# Patient Record
Sex: Male | Born: 1975 | Race: White | Hispanic: No | Marital: Married | Smoking: Current every day smoker
Health system: Southern US, Community
[De-identification: ages and names within clinical notes are randomized; demographics above are authoritative.]

## PROBLEM LIST (undated history)

## (undated) DIAGNOSIS — N200 Calculus of kidney: Secondary | ICD-10-CM

## (undated) DIAGNOSIS — M549 Dorsalgia, unspecified: Secondary | ICD-10-CM

## (undated) DIAGNOSIS — G8929 Other chronic pain: Secondary | ICD-10-CM

## (undated) DIAGNOSIS — I10 Essential (primary) hypertension: Secondary | ICD-10-CM

---

## 2002-07-27 ENCOUNTER — Emergency Department (HOSPITAL_COMMUNITY): Admission: EM | Admit: 2002-07-27 | Discharge: 2002-07-27 | Payer: Self-pay | Admitting: Emergency Medicine

## 2003-01-26 ENCOUNTER — Emergency Department (HOSPITAL_COMMUNITY): Admission: EM | Admit: 2003-01-26 | Discharge: 2003-01-26 | Payer: Self-pay | Admitting: *Deleted

## 2003-03-07 ENCOUNTER — Emergency Department (HOSPITAL_COMMUNITY): Admission: EM | Admit: 2003-03-07 | Discharge: 2003-03-07 | Payer: Self-pay | Admitting: *Deleted

## 2003-03-14 ENCOUNTER — Emergency Department (HOSPITAL_COMMUNITY): Admission: EM | Admit: 2003-03-14 | Discharge: 2003-03-14 | Payer: Self-pay | Admitting: Emergency Medicine

## 2003-05-11 ENCOUNTER — Emergency Department (HOSPITAL_COMMUNITY): Admission: EM | Admit: 2003-05-11 | Discharge: 2003-05-11 | Payer: Self-pay | Admitting: Emergency Medicine

## 2003-06-17 ENCOUNTER — Emergency Department (HOSPITAL_COMMUNITY): Admission: EM | Admit: 2003-06-17 | Discharge: 2003-06-17 | Payer: Self-pay | Admitting: Emergency Medicine

## 2003-08-16 ENCOUNTER — Emergency Department (HOSPITAL_COMMUNITY): Admission: EM | Admit: 2003-08-16 | Discharge: 2003-08-16 | Payer: Self-pay | Admitting: Emergency Medicine

## 2003-09-17 ENCOUNTER — Emergency Department (HOSPITAL_COMMUNITY): Admission: EM | Admit: 2003-09-17 | Discharge: 2003-09-17 | Payer: Self-pay | Admitting: Emergency Medicine

## 2003-10-22 ENCOUNTER — Emergency Department (HOSPITAL_COMMUNITY): Admission: EM | Admit: 2003-10-22 | Discharge: 2003-10-22 | Payer: Self-pay | Admitting: Emergency Medicine

## 2003-12-22 ENCOUNTER — Emergency Department (HOSPITAL_COMMUNITY): Admission: EM | Admit: 2003-12-22 | Discharge: 2003-12-22 | Payer: Self-pay | Admitting: *Deleted

## 2004-01-18 ENCOUNTER — Emergency Department (HOSPITAL_COMMUNITY): Admission: EM | Admit: 2004-01-18 | Discharge: 2004-01-18 | Payer: Self-pay | Admitting: Emergency Medicine

## 2004-01-22 ENCOUNTER — Emergency Department (HOSPITAL_COMMUNITY): Admission: EM | Admit: 2004-01-22 | Discharge: 2004-01-22 | Payer: Self-pay | Admitting: Emergency Medicine

## 2004-05-05 ENCOUNTER — Emergency Department (HOSPITAL_COMMUNITY): Admission: EM | Admit: 2004-05-05 | Discharge: 2004-05-05 | Payer: Self-pay | Admitting: *Deleted

## 2004-07-18 ENCOUNTER — Emergency Department (HOSPITAL_COMMUNITY): Admission: EM | Admit: 2004-07-18 | Discharge: 2004-07-18 | Payer: Self-pay | Admitting: *Deleted

## 2004-08-09 ENCOUNTER — Emergency Department (HOSPITAL_COMMUNITY): Admission: EM | Admit: 2004-08-09 | Discharge: 2004-08-09 | Payer: Self-pay | Admitting: Emergency Medicine

## 2004-09-07 ENCOUNTER — Emergency Department (HOSPITAL_COMMUNITY): Admission: EM | Admit: 2004-09-07 | Discharge: 2004-09-07 | Payer: Self-pay | Admitting: Emergency Medicine

## 2004-09-27 ENCOUNTER — Emergency Department (HOSPITAL_COMMUNITY): Admission: EM | Admit: 2004-09-27 | Discharge: 2004-09-27 | Payer: Self-pay | Admitting: Emergency Medicine

## 2004-10-19 ENCOUNTER — Emergency Department (HOSPITAL_COMMUNITY): Admission: EM | Admit: 2004-10-19 | Discharge: 2004-10-19 | Payer: Self-pay | Admitting: Emergency Medicine

## 2004-12-16 ENCOUNTER — Emergency Department (HOSPITAL_COMMUNITY): Admission: EM | Admit: 2004-12-16 | Discharge: 2004-12-16 | Payer: Self-pay | Admitting: Emergency Medicine

## 2005-01-01 ENCOUNTER — Emergency Department (HOSPITAL_COMMUNITY): Admission: EM | Admit: 2005-01-01 | Discharge: 2005-01-01 | Payer: Self-pay | Admitting: Emergency Medicine

## 2005-01-30 ENCOUNTER — Emergency Department (HOSPITAL_COMMUNITY): Admission: EM | Admit: 2005-01-30 | Discharge: 2005-01-30 | Payer: Self-pay | Admitting: Emergency Medicine

## 2005-04-16 ENCOUNTER — Emergency Department (HOSPITAL_COMMUNITY): Admission: EM | Admit: 2005-04-16 | Discharge: 2005-04-16 | Payer: Self-pay | Admitting: Emergency Medicine

## 2005-05-04 ENCOUNTER — Emergency Department (HOSPITAL_COMMUNITY): Admission: EM | Admit: 2005-05-04 | Discharge: 2005-05-04 | Payer: Self-pay | Admitting: Emergency Medicine

## 2005-06-15 ENCOUNTER — Emergency Department (HOSPITAL_COMMUNITY): Admission: EM | Admit: 2005-06-15 | Discharge: 2005-06-15 | Payer: Self-pay | Admitting: Emergency Medicine

## 2005-08-01 ENCOUNTER — Emergency Department (HOSPITAL_COMMUNITY): Admission: EM | Admit: 2005-08-01 | Discharge: 2005-08-01 | Payer: Self-pay | Admitting: Emergency Medicine

## 2005-09-15 ENCOUNTER — Emergency Department (HOSPITAL_COMMUNITY): Admission: EM | Admit: 2005-09-15 | Discharge: 2005-09-15 | Payer: Self-pay | Admitting: Emergency Medicine

## 2005-11-01 ENCOUNTER — Emergency Department (HOSPITAL_COMMUNITY): Admission: EM | Admit: 2005-11-01 | Discharge: 2005-11-01 | Payer: Self-pay | Admitting: Emergency Medicine

## 2005-11-16 ENCOUNTER — Emergency Department (HOSPITAL_COMMUNITY): Admission: EM | Admit: 2005-11-16 | Discharge: 2005-11-16 | Payer: Self-pay | Admitting: Emergency Medicine

## 2006-01-26 ENCOUNTER — Emergency Department (HOSPITAL_COMMUNITY): Admission: EM | Admit: 2006-01-26 | Discharge: 2006-01-26 | Payer: Self-pay | Admitting: Emergency Medicine

## 2006-03-15 ENCOUNTER — Emergency Department (HOSPITAL_COMMUNITY): Admission: EM | Admit: 2006-03-15 | Discharge: 2006-03-15 | Payer: Self-pay | Admitting: Emergency Medicine

## 2006-06-10 ENCOUNTER — Emergency Department (HOSPITAL_COMMUNITY): Admission: EM | Admit: 2006-06-10 | Discharge: 2006-06-11 | Payer: Self-pay | Admitting: Emergency Medicine

## 2006-06-19 ENCOUNTER — Emergency Department (HOSPITAL_COMMUNITY): Admission: EM | Admit: 2006-06-19 | Discharge: 2006-06-19 | Payer: Self-pay | Admitting: Emergency Medicine

## 2006-07-21 ENCOUNTER — Emergency Department (HOSPITAL_COMMUNITY): Admission: EM | Admit: 2006-07-21 | Discharge: 2006-07-21 | Payer: Self-pay | Admitting: Emergency Medicine

## 2006-08-18 ENCOUNTER — Emergency Department (HOSPITAL_COMMUNITY): Admission: EM | Admit: 2006-08-18 | Discharge: 2006-08-18 | Payer: Self-pay | Admitting: Emergency Medicine

## 2006-12-03 ENCOUNTER — Emergency Department (HOSPITAL_COMMUNITY): Admission: EM | Admit: 2006-12-03 | Discharge: 2006-12-03 | Payer: Self-pay | Admitting: Emergency Medicine

## 2007-05-05 ENCOUNTER — Emergency Department (HOSPITAL_COMMUNITY): Admission: EM | Admit: 2007-05-05 | Discharge: 2007-05-06 | Payer: Self-pay | Admitting: Emergency Medicine

## 2007-08-17 ENCOUNTER — Emergency Department (HOSPITAL_COMMUNITY): Admission: EM | Admit: 2007-08-17 | Discharge: 2007-08-17 | Payer: Self-pay | Admitting: Emergency Medicine

## 2007-10-22 ENCOUNTER — Emergency Department (HOSPITAL_COMMUNITY): Admission: EM | Admit: 2007-10-22 | Discharge: 2007-10-22 | Payer: Self-pay | Admitting: Emergency Medicine

## 2007-12-03 ENCOUNTER — Emergency Department (HOSPITAL_COMMUNITY): Admission: EM | Admit: 2007-12-03 | Discharge: 2007-12-03 | Payer: Self-pay | Admitting: Emergency Medicine

## 2008-02-17 ENCOUNTER — Emergency Department (HOSPITAL_COMMUNITY): Admission: EM | Admit: 2008-02-17 | Discharge: 2008-02-17 | Payer: Self-pay | Admitting: Emergency Medicine

## 2008-03-04 ENCOUNTER — Emergency Department (HOSPITAL_COMMUNITY): Admission: EM | Admit: 2008-03-04 | Discharge: 2008-03-05 | Payer: Self-pay | Admitting: Emergency Medicine

## 2008-03-25 ENCOUNTER — Emergency Department (HOSPITAL_COMMUNITY): Admission: EM | Admit: 2008-03-25 | Discharge: 2008-03-25 | Payer: Self-pay | Admitting: Emergency Medicine

## 2008-05-01 ENCOUNTER — Emergency Department (HOSPITAL_COMMUNITY): Admission: EM | Admit: 2008-05-01 | Discharge: 2008-05-01 | Payer: Self-pay | Admitting: Emergency Medicine

## 2008-05-06 ENCOUNTER — Emergency Department (HOSPITAL_COMMUNITY): Admission: EM | Admit: 2008-05-06 | Discharge: 2008-05-06 | Payer: Self-pay | Admitting: Emergency Medicine

## 2008-06-22 ENCOUNTER — Emergency Department (HOSPITAL_COMMUNITY): Admission: EM | Admit: 2008-06-22 | Discharge: 2008-06-22 | Payer: Self-pay | Admitting: Emergency Medicine

## 2008-09-25 ENCOUNTER — Emergency Department (HOSPITAL_COMMUNITY): Admission: EM | Admit: 2008-09-25 | Discharge: 2008-09-25 | Payer: Self-pay | Admitting: Emergency Medicine

## 2008-10-07 ENCOUNTER — Ambulatory Visit (HOSPITAL_COMMUNITY): Admission: RE | Admit: 2008-10-07 | Discharge: 2008-10-07 | Payer: Self-pay | Admitting: Urology

## 2008-11-26 ENCOUNTER — Emergency Department (HOSPITAL_COMMUNITY): Admission: EM | Admit: 2008-11-26 | Discharge: 2008-11-26 | Payer: Self-pay | Admitting: Emergency Medicine

## 2008-12-15 ENCOUNTER — Emergency Department (HOSPITAL_COMMUNITY): Admission: EM | Admit: 2008-12-15 | Discharge: 2008-12-15 | Payer: Self-pay | Admitting: Emergency Medicine

## 2008-12-17 ENCOUNTER — Emergency Department (HOSPITAL_COMMUNITY): Admission: EM | Admit: 2008-12-17 | Discharge: 2008-12-18 | Payer: Self-pay | Admitting: Emergency Medicine

## 2009-01-18 ENCOUNTER — Emergency Department (HOSPITAL_COMMUNITY): Admission: EM | Admit: 2009-01-18 | Discharge: 2009-01-18 | Payer: Self-pay | Admitting: Emergency Medicine

## 2009-01-31 ENCOUNTER — Emergency Department (HOSPITAL_COMMUNITY): Admission: EM | Admit: 2009-01-31 | Discharge: 2009-01-31 | Payer: Self-pay | Admitting: Emergency Medicine

## 2009-03-01 ENCOUNTER — Emergency Department: Payer: Self-pay | Admitting: Emergency Medicine

## 2009-04-17 ENCOUNTER — Emergency Department (HOSPITAL_COMMUNITY): Admission: EM | Admit: 2009-04-17 | Discharge: 2009-04-17 | Payer: Self-pay | Admitting: Emergency Medicine

## 2009-05-24 ENCOUNTER — Emergency Department (HOSPITAL_COMMUNITY): Admission: EM | Admit: 2009-05-24 | Discharge: 2009-05-24 | Payer: Self-pay | Admitting: Emergency Medicine

## 2009-10-04 IMAGING — CT CT ABDOMEN W/O CM
2 of 4 series · 17 of 46 positions shown, 19 images · non-contrast
Comparison: 03/04/2008

CT ABDOMEN

CLINICAL DATA: Right flank pain and hematuria.  Nephrolithiasis.

CT ABDOMEN AND PELVIS WITHOUT CONTRAST
TECHNIQUE: Multidetector CT imaging of the abdomen and pelvis was
performed following the standard protocol without intravenous
contrast.

[Series 2: stone under 45 under 200 w/p 5.0m · axial · 0.66mm/px · z∈[-454,-30]mm · 14 of 95 slices shown, 16 images]
[im 5/95  soft-tissue]
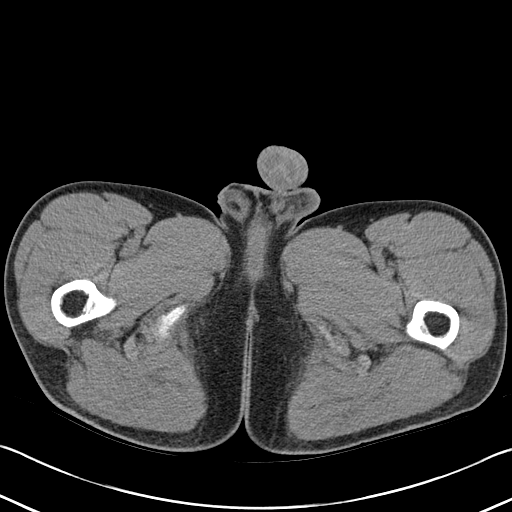
[im 5/95  bone]
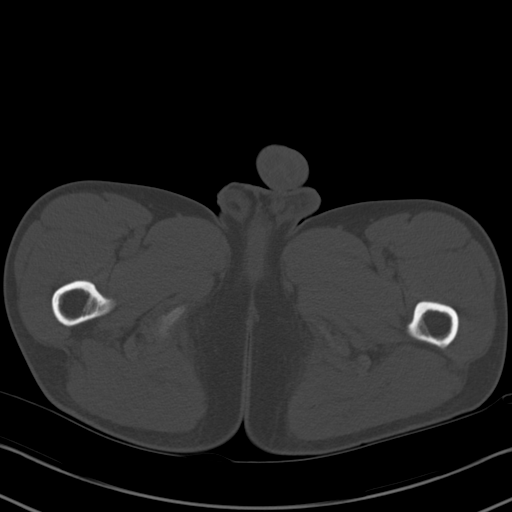
[im 13/95  soft-tissue]
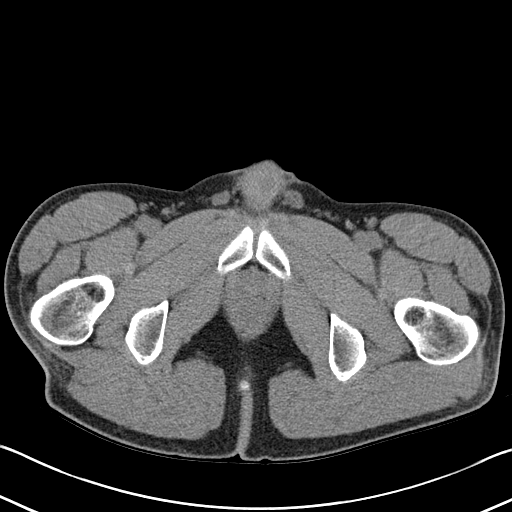
[im 17/95  soft-tissue]
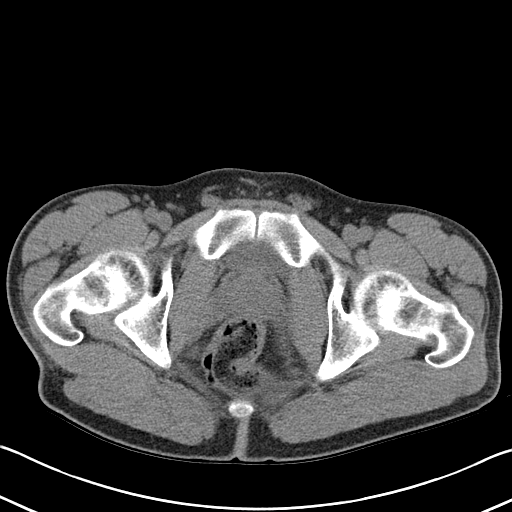
[im 25/95  soft-tissue]
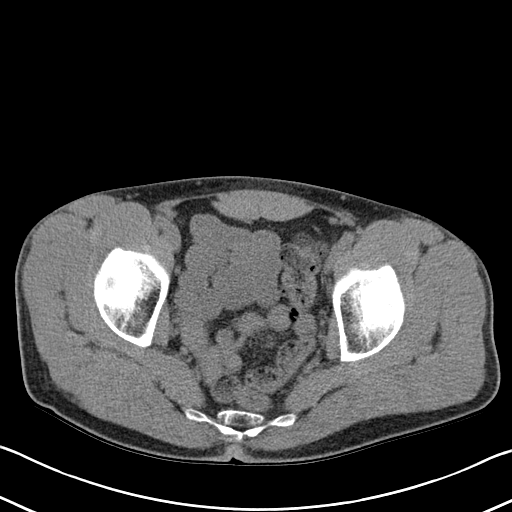
[im 33/95  soft-tissue]
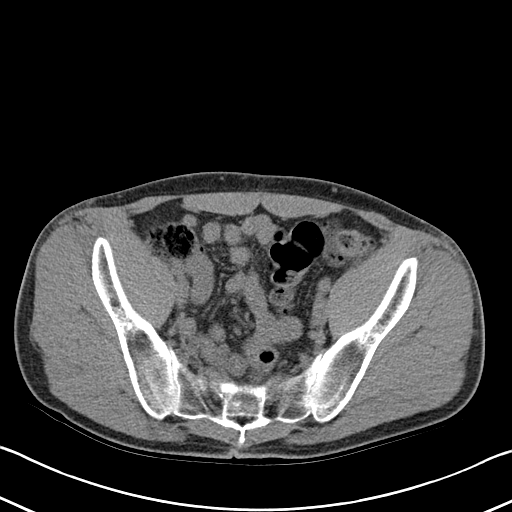
[im 37/95  soft-tissue]
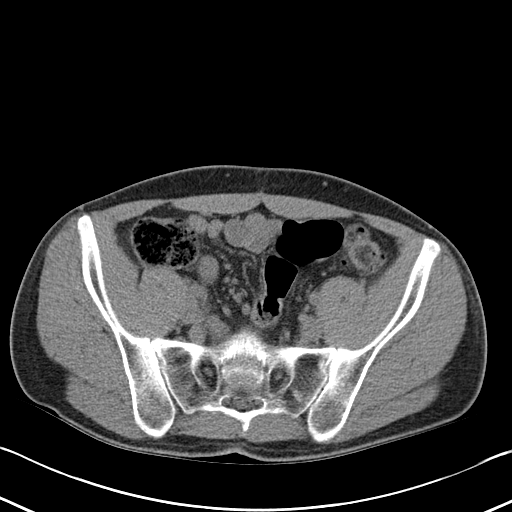
[im 45/95  soft-tissue]
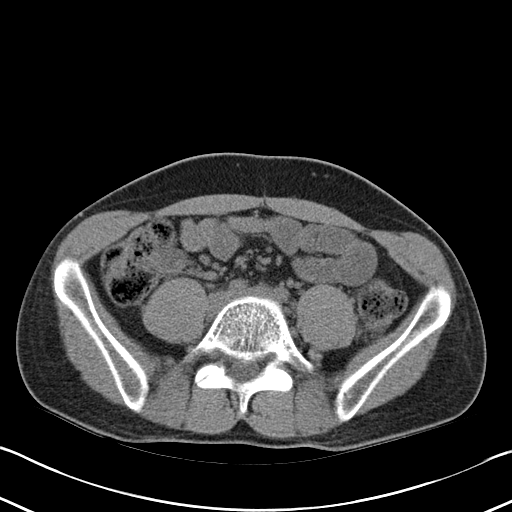
[im 50/95  soft-tissue]
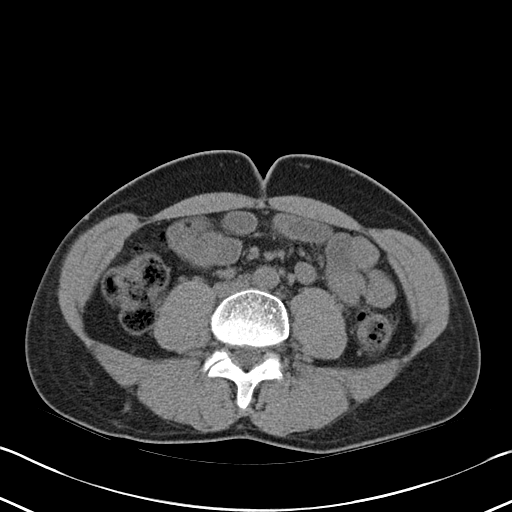
[im 58/95  soft-tissue]
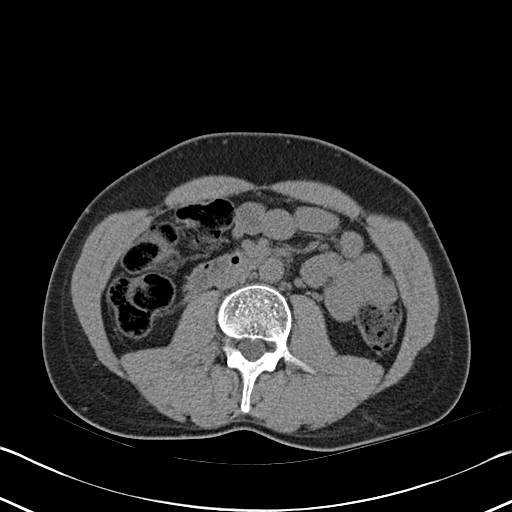
[im 58/95  bone]
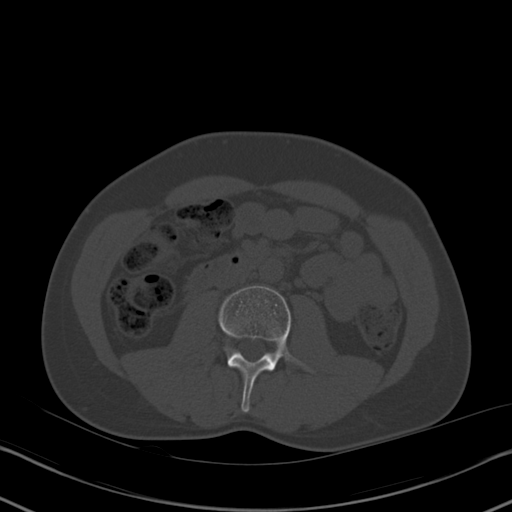
[im 62/95  soft-tissue]
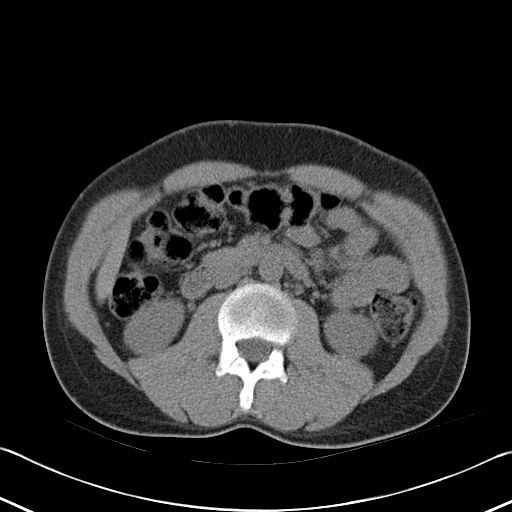
[im 70/95  soft-tissue]
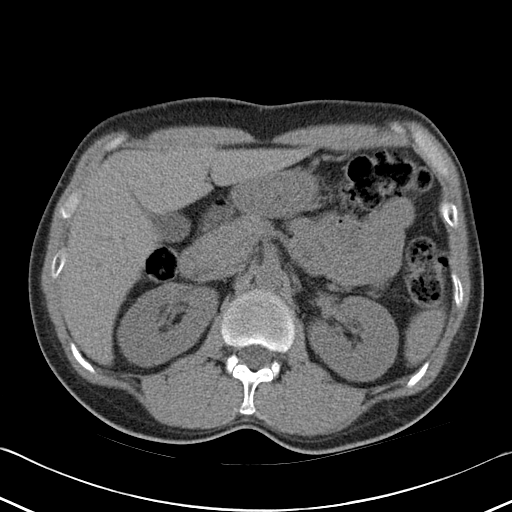
[im 78/95  soft-tissue]
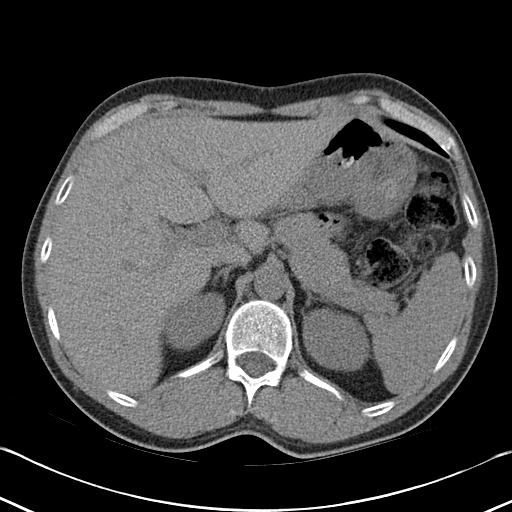
[im 82/95  soft-tissue]
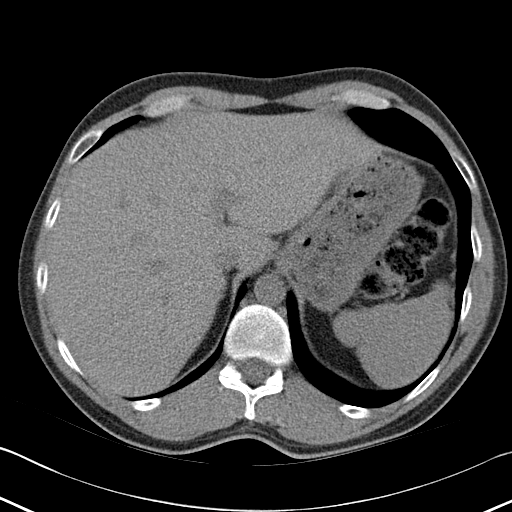
[im 90/95  soft-tissue]
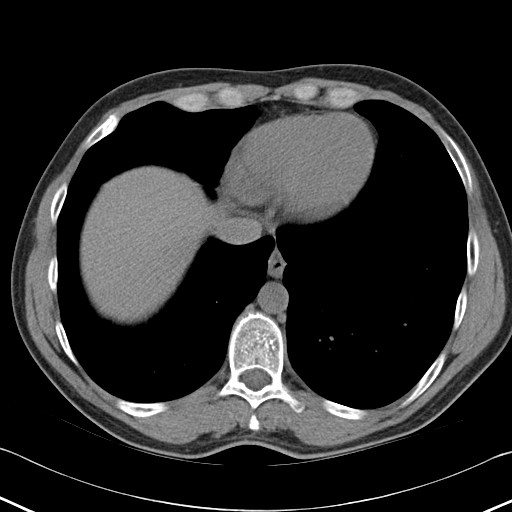

[Series 4: mpr coronal (id) · coronal · 0.93mm/px · 3 of 78 slices shown]
[im 26/78  soft-tissue]
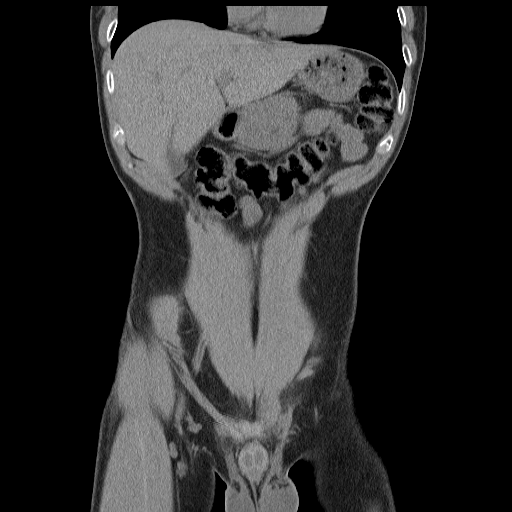
[im 35/78  soft-tissue]
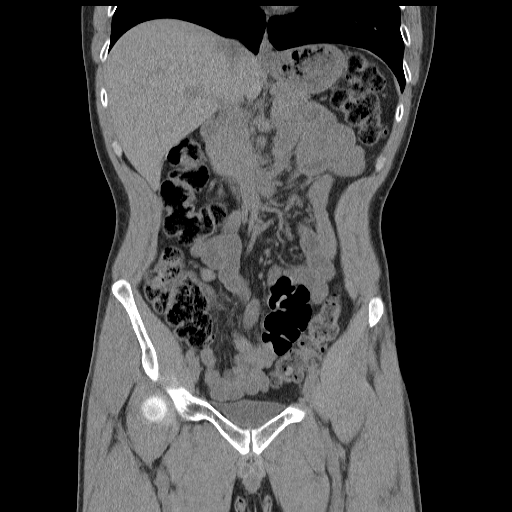
[im 43/78  soft-tissue]
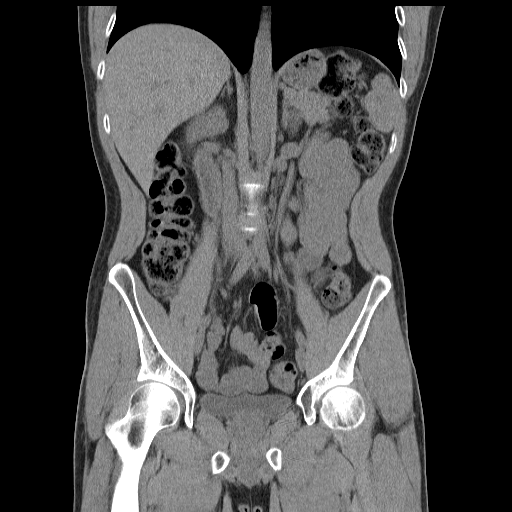

[17 of 46 positions shown; findings below may reference images not displayed]

FINDINGS: Multiple tiny less than 5 mm bilateral renal calculi are
again seen.  There is no evidence of hydronephrosis involving
either kidney.  There is no evidence of perinephric fluid or
inflammatory changes.

The abdominal parenchymal organs are otherwise unremarkable
appearance on this noncontrast study.  There is no evidence of mass
or inflammatory process.  No abnormal fluid collections are seen.
There is no evidence of dilated bowel loops.
IMPRESSION: Bilateral nephrolithiasis.  No evidence of hydronephrosis or other
acute findings.

CT PELVIS
FINDINGS: There is no evidence of ureteral calculi or dilatation.
Several left pelvic phleboliths are stable.

There is no evidence of pelvic mass or inflammatory process.  No
abnormal fluid collections are seen.  There is no evidence of
dilated bowel loops.
IMPRESSION: No evidence of ureteral calculi or other acute findings.

## 2010-06-14 ENCOUNTER — Emergency Department (HOSPITAL_COMMUNITY)
Admission: EM | Admit: 2010-06-14 | Discharge: 2010-06-14 | Payer: Self-pay | Source: Home / Self Care | Admitting: Emergency Medicine

## 2010-06-28 ENCOUNTER — Encounter: Payer: Self-pay | Admitting: Orthopaedic Surgery

## 2010-06-29 ENCOUNTER — Encounter: Payer: Self-pay | Admitting: Family Medicine

## 2010-09-14 LAB — URINALYSIS, ROUTINE W REFLEX MICROSCOPIC
Ketones, ur: NEGATIVE mg/dL
Nitrite: NEGATIVE
Protein, ur: NEGATIVE mg/dL

## 2010-09-16 LAB — URINALYSIS, ROUTINE W REFLEX MICROSCOPIC
Ketones, ur: NEGATIVE mg/dL
Nitrite: POSITIVE — AB
Protein, ur: NEGATIVE mg/dL
Urobilinogen, UA: 0.2 mg/dL (ref 0.0–1.0)
pH: 5.5 (ref 5.0–8.0)

## 2010-09-16 LAB — URINE MICROSCOPIC-ADD ON

## 2011-03-08 LAB — URINALYSIS, ROUTINE W REFLEX MICROSCOPIC
Leukocytes, UA: NEGATIVE
Nitrite: POSITIVE — AB
Protein, ur: >300 — AB
Urobilinogen, UA: 1

## 2011-03-10 LAB — CULTURE, ROUTINE-ABSCESS

## 2011-03-19 ENCOUNTER — Emergency Department (HOSPITAL_COMMUNITY)
Admission: EM | Admit: 2011-03-19 | Discharge: 2011-03-19 | Disposition: A | Discharge status: Home / Self Care | Payer: Self-pay | Attending: Emergency Medicine | Admitting: Emergency Medicine

## 2011-03-19 ENCOUNTER — Emergency Department (HOSPITAL_COMMUNITY): Payer: Self-pay

## 2011-03-19 ENCOUNTER — Encounter: Payer: Self-pay | Admitting: Oncology

## 2011-03-19 DIAGNOSIS — S20219A Contusion of unspecified front wall of thorax, initial encounter: Secondary | ICD-10-CM | POA: Insufficient documentation

## 2011-03-19 DIAGNOSIS — R079 Chest pain, unspecified: Secondary | ICD-10-CM | POA: Insufficient documentation

## 2011-03-19 DIAGNOSIS — Z87442 Personal history of urinary calculi: Secondary | ICD-10-CM | POA: Insufficient documentation

## 2011-03-19 DIAGNOSIS — J45909 Unspecified asthma, uncomplicated: Secondary | ICD-10-CM | POA: Insufficient documentation

## 2011-03-19 HISTORY — DX: Calculus of kidney: N20.0

## 2011-03-19 MED ORDER — HYDROCODONE-ACETAMINOPHEN 5-500 MG PO TABS: 1.0000 | ORAL_TABLET | Freq: Four times a day (QID) | ORAL | Status: AC | PRN

## 2011-03-19 NOTE — ED Notes (Signed)
Pt discussed with Dr. Rubin Payor; CXR order obtained.
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
## 2011-04-21 ENCOUNTER — Emergency Department (HOSPITAL_COMMUNITY)
Admission: EM | Admit: 2011-04-21 | Discharge: 2011-04-21 | Disposition: A | Discharge status: Home / Self Care | Payer: Medicaid Other | Attending: Emergency Medicine | Admitting: Emergency Medicine

## 2011-04-21 ENCOUNTER — Emergency Department (HOSPITAL_COMMUNITY): Payer: Medicaid Other

## 2011-04-21 ENCOUNTER — Encounter (HOSPITAL_COMMUNITY): Payer: Self-pay | Admitting: *Deleted

## 2011-04-21 DIAGNOSIS — S4990XA Unspecified injury of shoulder and upper arm, unspecified arm, initial encounter: Secondary | ICD-10-CM

## 2011-04-21 DIAGNOSIS — S4350XA Sprain of unspecified acromioclavicular joint, initial encounter: Secondary | ICD-10-CM | POA: Insufficient documentation

## 2011-04-21 DIAGNOSIS — M25519 Pain in unspecified shoulder: Secondary | ICD-10-CM | POA: Insufficient documentation

## 2011-04-21 DIAGNOSIS — J45909 Unspecified asthma, uncomplicated: Secondary | ICD-10-CM | POA: Insufficient documentation

## 2011-04-21 DIAGNOSIS — Z87442 Personal history of urinary calculi: Secondary | ICD-10-CM | POA: Insufficient documentation

## 2011-04-21 DIAGNOSIS — F17200 Nicotine dependence, unspecified, uncomplicated: Secondary | ICD-10-CM | POA: Insufficient documentation
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
## 2011-04-21 NOTE — ED Notes (Signed)
Pt given discharge instructions, paperwork, pt verbalized understanding.   

## 2011-05-20 ENCOUNTER — Encounter (HOSPITAL_COMMUNITY): Payer: Self-pay | Admitting: Emergency Medicine

## 2011-05-20 ENCOUNTER — Emergency Department (HOSPITAL_COMMUNITY)
Admission: EM | Admit: 2011-05-20 | Discharge: 2011-05-20 | Disposition: A | Discharge status: Home / Self Care | Payer: Medicaid Other | Attending: Emergency Medicine | Admitting: Emergency Medicine

## 2011-05-20 DIAGNOSIS — J45909 Unspecified asthma, uncomplicated: Secondary | ICD-10-CM | POA: Insufficient documentation

## 2011-05-20 DIAGNOSIS — B3749 Other urogenital candidiasis: Secondary | ICD-10-CM | POA: Insufficient documentation

## 2011-05-20 DIAGNOSIS — N200 Calculus of kidney: Secondary | ICD-10-CM | POA: Insufficient documentation

## 2011-05-20 DIAGNOSIS — F17200 Nicotine dependence, unspecified, uncomplicated: Secondary | ICD-10-CM | POA: Insufficient documentation

## 2011-05-20 HISTORY — DX: Calculus of kidney: N20.0

## 2011-05-20 LAB — URINALYSIS, ROUTINE W REFLEX MICROSCOPIC
Bilirubin Urine: NEGATIVE
Glucose, UA: NEGATIVE mg/dL
Leukocytes, UA: NEGATIVE
Protein, ur: 30 mg/dL — AB

## 2011-05-20 LAB — URINE MICROSCOPIC-ADD ON

## 2011-05-20 MED ORDER — TAMSULOSIN HCL 0.4 MG PO CAPS: 0.4000 mg | ORAL_CAPSULE | Freq: Two times a day (BID) | ORAL | Status: DC

## 2011-05-20 MED ORDER — HYDROCODONE-ACETAMINOPHEN 5-500 MG PO TABS: 1.0000 | ORAL_TABLET | Freq: Four times a day (QID) | ORAL | Status: AC | PRN

## 2011-05-20 MED ORDER — NAPROXEN 500 MG PO TABS: 500.0000 mg | ORAL_TABLET | Freq: Two times a day (BID) | ORAL | Status: AC

## 2011-05-20 MED ORDER — KETOROLAC TROMETHAMINE 60 MG/2ML IM SOLN
60.0000 mg | Freq: Once | INTRAMUSCULAR | Status: AC
  Administered 2011-05-20: 60 mg via INTRAMUSCULAR
  Filled 2011-05-20: qty 2

## 2011-05-20 MED ORDER — OXYCODONE-ACETAMINOPHEN 5-325 MG PO TABS
2.0000 | ORAL_TABLET | Freq: Once | ORAL | Status: AC
  Administered 2011-05-20: 2 via ORAL
  Filled 2011-05-20: qty 2

## 2011-05-20 MED ORDER — FLUCONAZOLE 150 MG PO TABS: 150.0000 mg | ORAL_TABLET | Freq: Once | ORAL | Status: AC

## 2011-05-20 NOTE — ED Provider Notes (Signed)
History     CSN: [id number] Arrival date & time: 05/20/2011 12:45 AM   First MD Initiated Contact with Patient 05/20/11 0103      Chief Complaint  Patient presents with  . Flank Pain    (Consider location/radiation/quality/duration/timing/severity/associated sxs/prior treatment) HPI Comments: 35 year old male with a history of frequent kidney stones who presents with acute onset of left flank pain approximately 6 hours prior to arrival, this pain is sharp, intermittent, waxes and wanes, radiates to the left groin. This is not associated with fevers chills nausea vomiting or hematuria. He states it feels the same as prior kidney stones. He's had many kidney stones and has passed them all successfully by himself without urologic intervention. Currently the pain is 8/10  Patient is a 35 y.o. male presenting with flank pain. The history is provided by the patient.  Flank Pain This is a new problem.    Past Medical History  Diagnosis Date  . Asthma   . Kidney stone   . Kidney stones     History reviewed. No pertinent past surgical history.  History reviewed. No pertinent family history.  History  Substance Use Topics  . Smoking status: Current Everyday Smoker  . Smokeless tobacco: Not on file  . Alcohol Use: Yes     "occassionally"      Review of Systems  Genitourinary: Positive for flank pain.  All other systems reviewed and are negative.    Allergies  Aspirin  Home Medications   Current Outpatient Rx  Name Route Sig Dispense Refill  . ACETAMINOPHEN 500 MG PO TABS Oral Take 1,000 mg by mouth daily as needed. For pain     . FLUCONAZOLE 150 MG PO TABS Oral Take 1 tablet (150 mg total) by mouth once. 1 tablet 0  . HYDROCODONE-ACETAMINOPHEN 5-500 MG PO TABS Oral Take 1-2 tablets by mouth every 6 (six) hours as needed for pain. 15 tablet 0  . NAPROXEN 500 MG PO TABS Oral Take 1 tablet (500 mg total) by mouth 2 (two) times daily with a meal. 30 tablet 0  . TAMSULOSIN  HCL 0.4 MG PO CAPS Oral Take 1 capsule (0.4 mg total) by mouth 2 (two) times daily. 10 capsule 0    BP 150/103  Pulse 79  Temp(Src) 97.7 F (36.5 C) (Oral)  Resp 16  Ht 5\' 8"  (1.727 m)  Wt 150 lb (68.04 kg)  BMI 22.81 kg/m2  SpO2 100%  Physical Exam  Nursing note and vitals reviewed. Constitutional: He appears well-developed and well-nourished.       Appears in mild discomfort  HENT:  Head: Normocephalic and atraumatic.  Mouth/Throat: Oropharynx is clear and moist. No oropharyngeal exudate.  Eyes: Conjunctivae and EOM are normal. Pupils are equal, round, and reactive to light. Right eye exhibits no discharge. Left eye exhibits no discharge. No scleral icterus.  Neck: Normal range of motion. Neck supple. No JVD present. No thyromegaly present.  Cardiovascular: Normal rate, regular rhythm, normal heart sounds and intact distal pulses.  Exam reveals no gallop and no friction rub.   No murmur heard. Pulmonary/Chest: Effort normal and breath sounds normal. No respiratory distress. He has no wheezes. He has no rales.  Abdominal: Soft. Bowel sounds are normal. He exhibits no distension and no mass. There is no tenderness.       Positive for CVA tenderness on the left  Musculoskeletal: Normal range of motion. He exhibits no edema and no tenderness.  Lymphadenopathy:    He has no cervical  adenopathy.  Neurological: He is alert. Coordination normal.  Skin: Skin is warm and dry. No rash noted. No erythema.  Psychiatric: He has a normal mood and affect. His behavior is normal.    ED Course  Procedures (including critical care time)  Labs Reviewed  URINALYSIS, ROUTINE W REFLEX MICROSCOPIC - Abnormal; Notable for the following:    Specific Gravity, Urine >1.030 (*)    Hgb urine dipstick LARGE (*)    Ketones, ur TRACE (*)    Protein, ur 30 (*)    All other components within normal limits  URINE MICROSCOPIC-ADD ON - Abnormal; Notable for the following:    Bacteria, UA FEW (*)    All  other components within normal limits   No results found.   1. Kidney stone on left side   2. Candidal urinary tract infection       MDM  Vital signs without fever or tachycardia, physical exam consistent with a left-sided ureterolithiasis.  We'll check urinalysis to rule out infection, otherwise with frequent kidney stones and history of same will treat symptomatically with urine strainer, intramuscular Toradol, home with Flomax and analgesics. Urologic followup information given    Intramuscular Toradol given, yeast infection seen on urinalysis, prescription given for same, patient is nontoxic.  Vida Roller, MD 05/20/11 [phone]

## 2011-05-20 NOTE — ED Notes (Signed)
Patient complaining of pain in left flank area. States "I feel like I have a kidney stone." Patient has history of kidney stones. Denies trauma, nausea/vomiting.

## 2011-05-20 NOTE — ED Notes (Signed)
Pt complains of left sided non radiating flank pain. Pt reports that the pain just started this evening. Pt reports experiencing some burning with urination. Pt states that he has noticed that he has to urinate a lot more than usual. Pt denies any nausea or vomiting. Pt reports that he does have a history of kidney stones and that this pain feels similar to that.

## 2011-05-20 NOTE — ED Notes (Signed)
Pt assisted to the bathroom at this time.

## 2013-03-30 ENCOUNTER — Emergency Department (HOSPITAL_COMMUNITY)
Admission: EM | Admit: 2013-03-30 | Discharge: 2013-03-30 | Disposition: A | Discharge status: Home / Self Care | Payer: Medicaid - Out of State | Attending: Emergency Medicine | Admitting: Emergency Medicine

## 2013-03-30 ENCOUNTER — Encounter (HOSPITAL_COMMUNITY): Payer: Self-pay | Admitting: Emergency Medicine

## 2013-03-30 DIAGNOSIS — R000 Tachycardia, unspecified: Secondary | ICD-10-CM | POA: Insufficient documentation

## 2013-03-30 DIAGNOSIS — K089 Disorder of teeth and supporting structures, unspecified: Secondary | ICD-10-CM | POA: Insufficient documentation

## 2013-03-30 DIAGNOSIS — Z87442 Personal history of urinary calculi: Secondary | ICD-10-CM | POA: Insufficient documentation

## 2013-03-30 DIAGNOSIS — Z79899 Other long term (current) drug therapy: Secondary | ICD-10-CM | POA: Insufficient documentation

## 2013-03-30 DIAGNOSIS — J45909 Unspecified asthma, uncomplicated: Secondary | ICD-10-CM | POA: Insufficient documentation

## 2013-03-30 DIAGNOSIS — F17200 Nicotine dependence, unspecified, uncomplicated: Secondary | ICD-10-CM | POA: Insufficient documentation

## 2013-03-30 DIAGNOSIS — K0889 Other specified disorders of teeth and supporting structures: Secondary | ICD-10-CM

## 2013-03-30 MED ORDER — TRAMADOL HCL 50 MG PO TABS: 50.0000 mg | ORAL_TABLET | Freq: Four times a day (QID) | ORAL | Status: DC | PRN

## 2013-03-30 MED ORDER — TRAMADOL HCL 50 MG PO TABS
50.0000 mg | ORAL_TABLET | Freq: Once | ORAL | Status: AC
  Administered 2013-03-30: 50 mg via ORAL
  Filled 2013-03-30: qty 1

## 2013-03-30 MED ORDER — AMOXICILLIN 250 MG PO CAPS
500.0000 mg | ORAL_CAPSULE | Freq: Once | ORAL | Status: AC
  Administered 2013-03-30: 500 mg via ORAL
  Filled 2013-03-30: qty 2

## 2013-03-30 MED ORDER — AMOXICILLIN 500 MG PO CAPS: 500.0000 mg | ORAL_CAPSULE | Freq: Three times a day (TID) | ORAL | Status: DC

## 2013-03-30 NOTE — ED Provider Notes (Signed)
CSN: [id number]     Arrival date & time 03/30/13  2143 History   First MD Initiated Contact with Patient 03/30/13 2151     Chief Complaint  Patient presents with  . Dental Pain   (Consider location/radiation/quality/duration/timing/severity/associated sxs/prior Treatment) Patient is a 37 y.o. male presenting with tooth pain. The history is provided by the patient.  Dental Pain Location:  Lower Lower teeth location:  25/RL central incisor, 24/LL central incisor, 23/LL lateral incisor and 26/RL lateral incisor Quality:  Throbbing and dull Severity:  Moderate Onset quality:  Gradual Duration:  2 days Timing:  Constant Progression:  Worsening Chronicity:  Recurrent Context: dental caries and poor dentition   Context: not malocclusion   Prior workup: patient states he was in process of having multiple dental extractions when he his Medicaid "ran out" Relieved by:  Nothing Worsened by:  Hot food/drink and cold food/drink Ineffective treatments:  Acetaminophen Associated symptoms: gum swelling   Associated symptoms: no congestion, no difficulty swallowing, no drooling, no facial pain, no facial swelling, no fever, no headaches, no neck pain, no neck swelling, no oral bleeding, no oral lesions and no trismus   Risk factors: lack of dental care, periodontal disease and smoking   Risk factors: no diabetes     Past Medical History  Diagnosis Date  . Asthma   . Kidney stone   . Kidney stones    History reviewed. No pertinent past surgical history. No family history on file. History  Substance Use Topics  . Smoking status: Current Every Day Smoker  . Smokeless tobacco: Not on file  . Alcohol Use: Yes     Comment: "occassionally"    Review of Systems  Constitutional: Negative for fever and appetite change.  HENT: Positive for dental problem. Negative for congestion, drooling, facial swelling, mouth sores, sore throat and trouble swallowing.   Eyes: Negative for pain and visual  disturbance.  Musculoskeletal: Negative for neck pain and neck stiffness.  Neurological: Negative for dizziness, facial asymmetry and headaches.  Hematological: Negative for adenopathy.  All other systems reviewed and are negative.    Allergies  Aspirin  Home Medications   Current Outpatient Rx  Name  Route  Sig  Dispense  Refill  . acetaminophen (TYLENOL) 500 MG tablet   Oral   Take 1,000 mg by mouth daily as needed. For pain          . amoxicillin (AMOXIL) 500 MG capsule   Oral   Take 1 capsule (500 mg total) by mouth 3 (three) times daily.   30 capsule   0   . Tamsulosin HCl (FLOMAX) 0.4 MG CAPS   Oral   Take 1 capsule (0.4 mg total) by mouth 2 (two) times daily.   10 capsule   0   . traMADol (ULTRAM) 50 MG tablet   Oral   Take 1 tablet (50 mg total) by mouth every 6 (six) hours as needed for pain.   12 tablet   0    BP 128/92  Pulse 129  Temp(Src) 98.3 F (36.8 C) (Oral)  Resp 20  Ht 5\' 8"  (1.727 m)  Wt 150 lb (68.04 kg)  BMI 22.81 kg/m2  SpO2 100% Physical Exam  Nursing note and vitals reviewed. Constitutional: He is oriented to person, place, and time. He appears well-developed and well-nourished. No distress.  HENT:  Head: Normocephalic and atraumatic.  Right Ear: Tympanic membrane and ear canal normal.  Left Ear: Tympanic membrane and ear canal normal.  Mouth/Throat:  Uvula is midline, oropharynx is clear and moist and mucous membranes are normal. No trismus in the jaw. Dental caries present. No dental abscesses or uvula swelling.  Dental caries of the lower incisors.  No facial swelling, obvious dental abscess, trismus, or sublingual abnml.    Neck: Normal range of motion. Neck supple.  Cardiovascular: Regular rhythm and intact distal pulses.  Tachycardia present.   No murmur heard. Pulmonary/Chest: Effort normal and breath sounds normal. No respiratory distress.  Musculoskeletal: Normal range of motion.  Lymphadenopathy:    He has no cervical  adenopathy.  Neurological: He is alert and oriented to person, place, and time. He exhibits normal muscle tone. Coordination normal.  Skin: Skin is warm and dry.    ED Course  Procedures (including critical care time) Labs Review Labs Reviewed - No data to display Imaging Review No results found.  EKG Interpretation   None       MDM   1. Pain, dental    Patient is edentulous except for lower central and lateral incisors.  No obvious dental abscess, facial edema or trismus.  Pt agrees to f/u with a dentist, referral info given.  Patient is tachycardic, but reports hx of same.  He denies chest pain, palpitations, dyspnea, IV drugs or cocaine use  Ambulatory, appears stable for discharge  Chelan Heringer L. Kennet Mccort, PA-C 03/31/13 0105

## 2013-03-30 NOTE — ED Notes (Signed)
Pt c/o dental pain x 2 days.  

## 2013-03-30 NOTE — ED Notes (Signed)
Pt alert & oriented x4, stable gait. Patient given discharge instructions, paperwork & prescription(s). Patient  instructed to stop at the registration desk to finish any additional paperwork. Patient verbalized understanding. Pt left department w/ no further questions. 

## 2013-03-30 NOTE — ED Notes (Signed)
All lower teeth left look to have decay. Pt has not tried to see dentist, attempting to get medicaid.

## 2013-03-31 NOTE — ED Provider Notes (Signed)
Medical screening examination/treatment/procedure(s) were performed by non-physician practitioner and as supervising physician I was immediately available for consultation/collaboration.  EKG Interpretation   None         Charles B. Sheldon, MD 03/31/13 1359 

## 2013-04-24 ENCOUNTER — Encounter (HOSPITAL_COMMUNITY): Payer: Self-pay | Admitting: Emergency Medicine

## 2013-04-24 ENCOUNTER — Emergency Department (HOSPITAL_COMMUNITY): Payer: Self-pay

## 2013-04-24 ENCOUNTER — Emergency Department (HOSPITAL_COMMUNITY)
Admission: EM | Admit: 2013-04-24 | Discharge: 2013-04-24 | Disposition: A | Discharge status: Home / Self Care | Payer: Self-pay | Attending: Emergency Medicine | Admitting: Emergency Medicine

## 2013-04-24 DIAGNOSIS — W11XXXA Fall on and from ladder, initial encounter: Secondary | ICD-10-CM | POA: Insufficient documentation

## 2013-04-24 DIAGNOSIS — S39012A Strain of muscle, fascia and tendon of lower back, initial encounter: Secondary | ICD-10-CM

## 2013-04-24 DIAGNOSIS — F17200 Nicotine dependence, unspecified, uncomplicated: Secondary | ICD-10-CM | POA: Insufficient documentation

## 2013-04-24 DIAGNOSIS — R000 Tachycardia, unspecified: Secondary | ICD-10-CM | POA: Insufficient documentation

## 2013-04-24 DIAGNOSIS — S335XXA Sprain of ligaments of lumbar spine, initial encounter: Secondary | ICD-10-CM | POA: Insufficient documentation

## 2013-04-24 DIAGNOSIS — Z87442 Personal history of urinary calculi: Secondary | ICD-10-CM | POA: Insufficient documentation

## 2013-04-24 DIAGNOSIS — Y939 Activity, unspecified: Secondary | ICD-10-CM | POA: Insufficient documentation

## 2013-04-24 DIAGNOSIS — J45909 Unspecified asthma, uncomplicated: Secondary | ICD-10-CM | POA: Insufficient documentation

## 2013-04-24 DIAGNOSIS — Y92009 Unspecified place in unspecified non-institutional (private) residence as the place of occurrence of the external cause: Secondary | ICD-10-CM | POA: Insufficient documentation

## 2013-04-24 DIAGNOSIS — F411 Generalized anxiety disorder: Secondary | ICD-10-CM | POA: Insufficient documentation

## 2013-04-24 MED ORDER — HYDROCODONE-ACETAMINOPHEN 5-325 MG PO TABS: 1.0000 | ORAL_TABLET | ORAL | Status: DC | PRN

## 2013-04-24 MED ORDER — IBUPROFEN 600 MG PO TABS: 600.0000 mg | ORAL_TABLET | Freq: Four times a day (QID) | ORAL | Status: DC | PRN

## 2013-04-24 MED ORDER — IBUPROFEN 800 MG PO TABS
800.0000 mg | ORAL_TABLET | Freq: Once | ORAL | Status: AC
  Administered 2013-04-24: 800 mg via ORAL
  Filled 2013-04-24: qty 1

## 2013-04-24 NOTE — ED Provider Notes (Signed)
Medical screening examination/treatment/procedure(s) were performed by non-physician practitioner and as supervising physician I was immediately available for consultation/collaboration.  EKG Interpretation   None         Gilda Crease, MD 04/24/13 [phone]

## 2013-04-24 NOTE — ED Provider Notes (Signed)
CSN: [id number]     Arrival date & time 04/24/13  1830 History   First MD Initiated Contact with Patient 04/24/13 1851     Chief Complaint  Patient presents with  . Back Pain   (Consider location/radiation/quality/duration/timing/severity/associated sxs/prior Treatment) HPI Comments: Alan Park is a 37 y.o. Male presenting with low back pain since falling at 1 pm from about 3 feet off a ladder, landing directly on his lower back onto ground at his home.  He has had pain since,  Especially with movement and walking. He denies weakness or numbness in his legs and has had no urinary return or incontinence since the event. He has taken tylenol with no relief of pain.  He does report occasional low back pain but denies any specific injuries to the back and has never been treated for this complaint before today.  He denies other injury including neck or head pain.      The history is provided by the patient.    Past Medical History  Diagnosis Date  . Asthma   . Kidney stone   . Kidney stones    History reviewed. No pertinent past surgical history. History reviewed. No pertinent family history. History  Substance Use Topics  . Smoking status: Current Every Day Smoker    Types: Cigarettes  . Smokeless tobacco: Not on file  . Alcohol Use: Yes     Comment: "occassionally"    Review of Systems  Constitutional: Negative for fever.  Respiratory: Negative for shortness of breath.   Cardiovascular: Negative for chest pain and leg swelling.  Gastrointestinal: Negative for abdominal pain, constipation and abdominal distention.  Genitourinary: Negative for dysuria, urgency, frequency, flank pain and difficulty urinating.  Musculoskeletal: Positive for back pain. Negative for gait problem, joint swelling and neck pain.  Skin: Negative for rash.  Neurological: Negative for weakness and numbness.    Allergies  Review of patient's allergies indicates no known allergies.  Home  Medications   Current Outpatient Rx  Name  Route  Sig  Dispense  Refill  . acetaminophen (TYLENOL) 500 MG tablet   Oral   Take 1,000 mg by mouth daily as needed. For pain          . HYDROcodone-acetaminophen (NORCO/VICODIN) 5-325 MG per tablet   Oral   Take 1 tablet by mouth every 4 (four) hours as needed for moderate pain.   15 tablet   0   . ibuprofen (ADVIL,MOTRIN) 600 MG tablet   Oral   Take 1 tablet (600 mg total) by mouth every 6 (six) hours as needed.   30 tablet   0    BP 126/99  Pulse 132  Temp(Src) 98.5 F (36.9 C) (Oral)  Resp 22  Ht 5\' 8"  (1.727 m)  Wt 150 lb (68.04 kg)  BMI 22.81 kg/m2  SpO2 97% Physical Exam  Nursing note and vitals reviewed. Constitutional: He appears well-developed and well-nourished.  HENT:  Head: Normocephalic.  Eyes: Conjunctivae are normal.  Neck: Normal range of motion. Neck supple.  Cardiovascular: Intact distal pulses.  Tachycardia present.   Pedal pulses normal.  Pulmonary/Chest: Effort normal.  Abdominal: Soft. Bowel sounds are normal. He exhibits no distension and no mass.  Musculoskeletal: Normal range of motion. He exhibits tenderness. He exhibits no edema.       Lumbar back: He exhibits bony tenderness and pain. He exhibits no swelling, no edema, no deformity and no spasm.  Tender midline lower lumbar spine.  No visible or palpable  deformity or injury.  Neurological: He is alert. He has normal strength. He displays no atrophy and no tremor. No sensory deficit. Gait normal.  Reflex Scores:      Patellar reflexes are 2+ on the right side and 2+ on the left side.      Achilles reflexes are 2+ on the right side and 2+ on the left side. No strength deficit noted in hip and knee flexor and extensor muscle groups.  Ankle flexion and extension intact.  Skin: Skin is warm and dry.  Psychiatric: His mood appears anxious.    ED Course  Procedures (including critical care time) Labs Review Labs Reviewed - No data to  display Imaging Review Dg Lumbar Spine Complete  04/24/2013   CLINICAL DATA:  Low back pain.  Fall from a ladder.  EXAM: LUMBAR SPINE - COMPLETE 4+ VIEW  COMPARISON:  09/25/2008  FINDINGS: Mild facet arthropathy bilaterally at L5-S1. No fracture or subluxation. Intervertebral disc spaces appear preserved in the lumbar spine. There is mild spurring anterior to the vertebral body column at T10-11. Marland Kitchen  IMPRESSION: 1. Mild lumbosacral facet arthropathy.  No acute findings.   Electronically Signed   By: Herbie Baltimore M.D.   On: 04/24/2013 19:36    EKG Interpretation   None       MDM   1. Lumbar strain, initial encounter    Patients labs and/or radiological studies were viewed and considered during the medical decision making and disposition process.  Pt prescribed ibuprofen, hydrocodone.  Encouraged ice tx for 2 days,  Adding heat on day 3.  Encouraged to avoid activity that worsens pain.  Referral guide given to establish care with pcp.  Pt states his medicaid will be approved in the next 1-2 weeks,  Is looking for a pcp.  Tachycardia noted after dc.  Anxious and in moderate pain.  Review of chart for previous visits for pain revealing tachycardia as well.  Suspect this is a pain reaction or possibly white coat syndrome.    Burgess Amor, PA-C 04/24/13 2026

## 2013-04-24 NOTE — ED Notes (Signed)
Pt states that he slipped off the ladder 2-3 feet from ground today and now has lower back pain

## 2013-05-23 ENCOUNTER — Encounter (HOSPITAL_COMMUNITY): Payer: Self-pay | Admitting: Emergency Medicine

## 2013-05-23 ENCOUNTER — Emergency Department (HOSPITAL_COMMUNITY)
Admission: EM | Admit: 2013-05-23 | Discharge: 2013-05-23 | Disposition: A | Discharge status: Home / Self Care | Payer: Medicaid - Out of State | Attending: Emergency Medicine | Admitting: Emergency Medicine

## 2013-05-23 DIAGNOSIS — M545 Low back pain, unspecified: Secondary | ICD-10-CM | POA: Insufficient documentation

## 2013-05-23 DIAGNOSIS — R000 Tachycardia, unspecified: Secondary | ICD-10-CM | POA: Insufficient documentation

## 2013-05-23 DIAGNOSIS — J45909 Unspecified asthma, uncomplicated: Secondary | ICD-10-CM | POA: Insufficient documentation

## 2013-05-23 DIAGNOSIS — Z87442 Personal history of urinary calculi: Secondary | ICD-10-CM | POA: Insufficient documentation

## 2013-05-23 DIAGNOSIS — F17200 Nicotine dependence, unspecified, uncomplicated: Secondary | ICD-10-CM | POA: Insufficient documentation

## 2013-05-23 DIAGNOSIS — M533 Sacrococcygeal disorders, not elsewhere classified: Secondary | ICD-10-CM | POA: Insufficient documentation

## 2013-05-23 MED ORDER — CYCLOBENZAPRINE HCL 10 MG PO TABS
10.0000 mg | ORAL_TABLET | Freq: Once | ORAL | Status: AC
  Administered 2013-05-23: 10 mg via ORAL
  Filled 2013-05-23: qty 1

## 2013-05-23 MED ORDER — IBUPROFEN 800 MG PO TABS: 800.0000 mg | ORAL_TABLET | Freq: Three times a day (TID) | ORAL | Status: DC

## 2013-05-23 MED ORDER — IBUPROFEN 800 MG PO TABS
800.0000 mg | ORAL_TABLET | Freq: Once | ORAL | Status: AC
  Administered 2013-05-23: 800 mg via ORAL
  Filled 2013-05-23: qty 1

## 2013-05-23 MED ORDER — CYCLOBENZAPRINE HCL 10 MG PO TABS: 10.0000 mg | ORAL_TABLET | Freq: Three times a day (TID) | ORAL | Status: DC

## 2013-05-23 NOTE — ED Notes (Signed)
I fell off of a ladder around the end of last month, came here and had my back xrayed and nothing was wrong. Still having a lot of trouble and would like a referral to a neurologist per pt.

## 2013-05-23 NOTE — ED Provider Notes (Signed)
Medical screening examination/treatment/procedure(s) were performed by non-physician practitioner and as supervising physician I was immediately available for consultation/collaboration.  EKG Interpretation   None         Glynn Octave, MD 05/23/13 2253

## 2013-05-23 NOTE — ED Provider Notes (Signed)
CSN: [id number]     Arrival date & time 05/23/13  2055 History   First MD Initiated Contact with Patient 05/23/13 2205     Chief Complaint  Patient presents with  . Back Pain   (Consider location/radiation/quality/duration/timing/severity/associated sxs/prior Treatment) HPI Comments: Patient is a 37 year old male who presents to the emergency department with complaint of back pain. Patient states he has had problems with his back for quite some time. Patient states that at approximately the last week of November he fell from a ladder, he was evaluated in the emergency department in the x-rays were negative for fracture or dislocation. The patient states since that time he's been having increasing problem with his back. He denies any loss of bowel or bladder function. He denies any frequent falls. And he denies any foot drop. He request referral to a neurology surgical specialist. An assistance with his pain.  Patient is a 37 y.o. male presenting with back pain. The history is provided by the patient.  Back Pain Location:  Lumbar spine Associated symptoms: no abdominal pain, no chest pain and no dysuria     Past Medical History  Diagnosis Date  . Asthma   . Kidney stone   . Kidney stones    History reviewed. No pertinent past surgical history. No family history on file. History  Substance Use Topics  . Smoking status: Current Every Day Smoker    Types: Cigarettes  . Smokeless tobacco: Not on file  . Alcohol Use: Yes     Comment: "occassionally"    Review of Systems  Constitutional: Negative for activity change.       All ROS Neg except as noted in HPI  HENT: Negative for nosebleeds.   Eyes: Negative for photophobia and discharge.  Respiratory: Negative for cough and shortness of breath.   Cardiovascular: Negative for chest pain and palpitations.  Gastrointestinal: Negative for abdominal pain and blood in stool.  Genitourinary: Negative for dysuria, frequency and hematuria.   Musculoskeletal: Positive for back pain. Negative for arthralgias and neck pain.  Skin: Negative.   Neurological: Negative for dizziness, seizures and speech difficulty.  Psychiatric/Behavioral: Negative for hallucinations and confusion.    Allergies  Review of patient's allergies indicates no known allergies.  Home Medications   Current Outpatient Rx  Name  Route  Sig  Dispense  Refill  . acetaminophen (TYLENOL) 500 MG tablet   Oral   Take 1,000 mg by mouth daily as needed. For pain          . PRESCRIPTION MEDICATION   Oral   Take 1 tablet by mouth daily. Blood pressure medication          BP 139/92  Pulse 121  Temp(Src) 98.3 F (36.8 C) (Oral)  Resp 20  Ht 5\' 8"  (1.727 m)  Wt 150 lb (68.04 kg)  BMI 22.81 kg/m2  SpO2 100% Physical Exam  Nursing note and vitals reviewed. Constitutional: He is oriented to person, place, and time. He appears well-developed and well-nourished.  Non-toxic appearance.  HENT:  Head: Normocephalic.  Right Ear: Tympanic membrane and external ear normal.  Left Ear: Tympanic membrane and external ear normal.  Eyes: EOM and lids are normal. Pupils are equal, round, and reactive to light.  Neck: Normal range of motion. Neck supple. Carotid bruit is not present.  Cardiovascular: Regular rhythm, normal heart sounds, intact distal pulses and normal pulses.  Tachycardia present.   Pulmonary/Chest: Breath sounds normal. No respiratory distress.  Abdominal: Soft. Bowel sounds are  normal. There is no tenderness. There is no guarding.  Musculoskeletal: Normal range of motion.  There is lumbar area is soreness to palpation. There is paraspinal area tenderness to palpation and attempted range of motion of the lumbar region. There is no palpable step off. There no hot areas appreciated.  Lymphadenopathy:       Head (right side): No submandibular adenopathy present.       Head (left side): No submandibular adenopathy present.    He has no cervical  adenopathy.  Neurological: He is alert and oriented to person, place, and time. He has normal strength. No cranial nerve deficit or sensory deficit. He exhibits normal muscle tone. Coordination normal.  Skin: Skin is warm and dry.  Psychiatric: He has a normal mood and affect. His speech is normal.    ED Course  Procedures (including critical care time) Labs Review Labs Reviewed - No data to display Imaging Review No results found.  EKG Interpretation   None       MDM  No diagnosis found. **I have reviewed nursing notes, vital signs, and all appropriate lab and imaging results for this patient.*  No gross neurologic deficit appreciated on examination at this time. Gait is steady. The patient has a tachycardia of 121 beats per minute. Review of previous emergency department visits reveals that the patient usually has a tachycardia present during examination. Patient given resources for the adult medicine clinic. Prescription for Flexeril and ibuprofen given to the patient. Discussed with the patient that he would need a PCP referral to neurosurgery, and this could not come from ED.  Kathie Dike, PA-C 05/23/13 2225

## 2013-05-25 ENCOUNTER — Encounter (HOSPITAL_COMMUNITY): Payer: Self-pay | Admitting: Emergency Medicine

## 2013-05-25 ENCOUNTER — Emergency Department (HOSPITAL_COMMUNITY)
Admission: EM | Admit: 2013-05-25 | Discharge: 2013-05-25 | Disposition: A | Discharge status: Home / Self Care | Payer: Medicaid - Out of State | Attending: Emergency Medicine | Admitting: Emergency Medicine

## 2013-05-25 DIAGNOSIS — J45901 Unspecified asthma with (acute) exacerbation: Secondary | ICD-10-CM | POA: Insufficient documentation

## 2013-05-25 DIAGNOSIS — Z87442 Personal history of urinary calculi: Secondary | ICD-10-CM | POA: Insufficient documentation

## 2013-05-25 DIAGNOSIS — M545 Low back pain, unspecified: Secondary | ICD-10-CM | POA: Insufficient documentation

## 2013-05-25 DIAGNOSIS — Z79899 Other long term (current) drug therapy: Secondary | ICD-10-CM | POA: Insufficient documentation

## 2013-05-25 DIAGNOSIS — F17200 Nicotine dependence, unspecified, uncomplicated: Secondary | ICD-10-CM | POA: Insufficient documentation

## 2013-05-25 DIAGNOSIS — Z791 Long term (current) use of non-steroidal anti-inflammatories (NSAID): Secondary | ICD-10-CM | POA: Insufficient documentation

## 2013-05-25 MED ORDER — ONDANSETRON HCL 4 MG PO TABS
4.0000 mg | ORAL_TABLET | Freq: Once | ORAL | Status: AC
  Administered 2013-05-25: 4 mg via ORAL
  Filled 2013-05-25: qty 1

## 2013-05-25 MED ORDER — DEXAMETHASONE 6 MG PO TABS: ORAL_TABLET | ORAL | Status: DC

## 2013-05-25 MED ORDER — TRAMADOL HCL 50 MG PO TABS: ORAL_TABLET | ORAL | Status: DC

## 2013-05-25 MED ORDER — DEXAMETHASONE SODIUM PHOSPHATE 4 MG/ML IJ SOLN
8.0000 mg | Freq: Once | INTRAMUSCULAR | Status: AC
  Administered 2013-05-25: 8 mg via INTRAMUSCULAR
  Filled 2013-05-25: qty 2

## 2013-05-25 MED ORDER — KETOROLAC TROMETHAMINE 60 MG/2ML IM SOLN
60.0000 mg | Freq: Once | INTRAMUSCULAR | Status: AC
  Administered 2013-05-25: 60 mg via INTRAMUSCULAR
  Filled 2013-05-25: qty 2

## 2013-05-25 NOTE — ED Notes (Signed)
Pt c/o back pain since last week. Pt was seen earlier this week here for the same.

## 2013-05-25 NOTE — ED Notes (Signed)
Pt alert, NAD, med given for back pain.
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
## 2013-05-30 NOTE — ED Provider Notes (Signed)
Medical screening examination/treatment/procedure(s) were performed by non-physician practitioner and as supervising physician I was immediately available for consultation/collaboration.  EKG Interpretation   None       Zowie Lundahl, MD, FACEP   Wava Kildow L Iban Utz, MD 05/30/13 1619 

## 2013-10-17 ENCOUNTER — Encounter (HOSPITAL_COMMUNITY): Payer: Self-pay | Admitting: Emergency Medicine

## 2013-10-17 ENCOUNTER — Emergency Department (HOSPITAL_COMMUNITY): Payer: Medicaid - Out of State

## 2013-10-17 ENCOUNTER — Emergency Department (HOSPITAL_COMMUNITY)
Admission: EM | Admit: 2013-10-17 | Discharge: 2013-10-17 | Disposition: A | Discharge status: Home / Self Care | Payer: Medicaid - Out of State | Attending: Emergency Medicine | Admitting: Emergency Medicine

## 2013-10-17 DIAGNOSIS — J45909 Unspecified asthma, uncomplicated: Secondary | ICD-10-CM | POA: Insufficient documentation

## 2013-10-17 DIAGNOSIS — F17200 Nicotine dependence, unspecified, uncomplicated: Secondary | ICD-10-CM | POA: Insufficient documentation

## 2013-10-17 DIAGNOSIS — Z791 Long term (current) use of non-steroidal anti-inflammatories (NSAID): Secondary | ICD-10-CM | POA: Insufficient documentation

## 2013-10-17 DIAGNOSIS — Z760 Encounter for issue of repeat prescription: Secondary | ICD-10-CM

## 2013-10-17 DIAGNOSIS — Y92410 Unspecified street and highway as the place of occurrence of the external cause: Secondary | ICD-10-CM | POA: Insufficient documentation

## 2013-10-17 DIAGNOSIS — Z87442 Personal history of urinary calculi: Secondary | ICD-10-CM | POA: Insufficient documentation

## 2013-10-17 DIAGNOSIS — S20212A Contusion of left front wall of thorax, initial encounter: Secondary | ICD-10-CM

## 2013-10-17 DIAGNOSIS — S20219A Contusion of unspecified front wall of thorax, initial encounter: Secondary | ICD-10-CM | POA: Insufficient documentation

## 2013-10-17 DIAGNOSIS — Z79899 Other long term (current) drug therapy: Secondary | ICD-10-CM | POA: Insufficient documentation

## 2013-10-17 DIAGNOSIS — IMO0002 Reserved for concepts with insufficient information to code with codable children: Secondary | ICD-10-CM | POA: Insufficient documentation

## 2013-10-17 DIAGNOSIS — Y9389 Activity, other specified: Secondary | ICD-10-CM | POA: Insufficient documentation

## 2013-10-17 MED ORDER — HYDROCODONE-ACETAMINOPHEN 5-325 MG PO TABS: 1.0000 | ORAL_TABLET | ORAL | Status: DC | PRN

## 2013-10-17 MED ORDER — LISINOPRIL-HYDROCHLOROTHIAZIDE 10-12.5 MG PO TABS: 1.0000 | ORAL_TABLET | Freq: Every day | ORAL | Status: DC
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
## 2013-10-17 NOTE — Discharge Instructions (Signed)
Chest Contusion A chest contusion is a deep bruise on your chest area. Contusions are the result of an injury that caused bleeding under the skin. A chest contusion may involve bruising of the skin, muscles, or ribs. The contusion may turn blue, purple, or yellow. Minor injuries will give you a painless contusion, but more severe contusions may stay painful and swollen for a few weeks. CAUSES  A contusion is usually caused by a blow, trauma, or direct force to an area of the body. SYMPTOMS   Swelling and redness of the injured area.  Discoloration of the injured area.  Tenderness and soreness of the injured area.  Pain. DIAGNOSIS  The diagnosis can be made by taking a history and performing a physical exam. An X-ray, CT scan, or MRI may be needed to determine if there were any associated injuries, such as broken bones (fractures) or internal injuries. TREATMENT  Often, the best treatment for a chest contusion is resting, icing, and applying cold compresses to the injured area. Deep breathing exercises may be recommended to reduce the risk of pneumonia. Over-the-counter medicines may also be recommended for pain control. HOME CARE INSTRUCTIONS   Put ice on the injured area.  Put ice in a plastic bag.  Place a towel between your skin and the bag.  Leave the ice on for 15-20 minutes, 03-04 times a day.  Only take over-the-counter or prescription medicines as directed by your caregiver. Your caregiver may recommend avoiding anti-inflammatory medicines (aspirin, ibuprofen, and naproxen) for 48 hours because these medicines may increase bruising.  Rest the injured area.  Perform deep-breathing exercises as directed by your caregiver.  Stop smoking if you smoke.  Do not lift objects over 5 pounds (2.3 kg) for 3 days or longer if recommended by your caregiver. SEEK IMMEDIATE MEDICAL CARE IF:   You have increased bruising or swelling.  You have pain that is getting worse.  You have  difficulty breathing.  You have dizziness, weakness, or fainting.  You have blood in your urine or stool.  You cough up or vomit blood.  Your swelling or pain is not relieved with medicines. MAKE SURE YOU:   Understand these instructions.  Will watch your condition.  Will get help right away if you are not doing well or get worse. Document Released: 02/16/2001 Document Revised: 02/16/2012 Document Reviewed: 11/15/2011 [facility] Patient Information 2014 [place], [place].   Emergency Department Resource Guide 1) Find a Doctor and Pay Out of Pocket Although you won't have to find out who is covered by your insurance plan, it is a good idea to ask around and get recommendations. You will then need to call the office and see if the doctor you have chosen will accept you as a new patient and what types of options they offer for patients who are self-pay. Some doctors offer discounts or will set up payment plans for their patients who do not have insurance, but you will need to ask so you aren't surprised when you get to your appointment.  2) Contact Your Local Health Department Not all health departments have doctors that can see patients for sick visits, but many do, so it is worth a call to see if yours does. If you don't know where your local health department is, you can check in your phone book. The CDC also has a tool to help you locate your state's health department, and many state websites also have listings of all of their local health departments.  3) Find a Walk-in Clinic If your illness is not likely to be very severe or complicated, you may want to try a walk in clinic. These are popping up all over the country in pharmacies, drugstores, and shopping centers. They're usually staffed by nurse practitioners or physician assistants that have been trained to treat common illnesses and complaints. They're usually fairly quick and inexpensive. However, if you have serious medical issues or  chronic medical problems, these are probably not your best option.  No Primary Care Doctor: - Call Health Connect at  [phone] - they can help you locate a primary care doctor that  accepts your insurance, provides certain services, etc. - Physician Referral Service- [phone]  Chronic Pain Problems: Organization         Address  Phone   Notes  [facility]  [phone] Patients need to be referred by their primary care doctor.   Medication Assistance: Organization         Address  Phone   Notes  [facility] [address] [place], [place] [address] [phone] --Must be a resident of [facility] -- Must have NO insurance coverage whatsoever (no Medicaid/ Medicare, etc.) -- The pt. MUST have a primary care doctor that directs their care regularly and follows them in the community   [facility]ist  [phone]   [employer]  [phone]    Agencies that provide inexpensive medical care: Organization         Address  Phone   Notes  [facility]  [phone]   [facility]    [phone]   [facility] [address] [place], [place] [address] [phone]   [facility] [address] [place] [place] [phone]   Planned Parenthood    [phone]   [facility]    [phone]   [facility]  [address], [place] Phone:  [phone], Fax:  [phone] Hours of Operation:  9 am - 6 pm, M-F.  Also accepts Medicaid/Medicare and self-pay.  [facility] [address] [facility]  [address], [place] Phone: [phone], Fax: [phone]. Hours of Operation:  8:30 am - 5:30 pm, M-F.  Also accepts Medicaid and self-pay.  [facility] [place] [address] [place] Phone: [phone]   [facility] [address] Natasha Bence [place], [place]  [phone], Ext. [phone] Mondays & Thursdays: 7-9 AM.  First 15 patients are seen on a first come, first serve basis.    Medicaid-accepting [facility] Providers:  Organization         Address  Phone   Notes  [facility] [address], [place] [phone] Also accepts self-pay patients.  [facility] [address] Laurell Josephs [place], [place]  [phone]   [facility] [address], [place] [phone]   [facility] [address] [place] [phone]   Renaye Rakers [address], [place]   [phone] Only accepts [place] patients after they have their name applied to their card.   Self-Pay (no insurance) in [facility]:  Organization         Address  Phone   Notes  Sickle Cell Patients, [facility] [phone]  Vaughan [address]asta [address], [place] [phone]   [facility] [address], [place] [phone]   [facility] [place]  [address], [place] [phone]   [facility]/Dr. Osei-Bonsu  [address] [place] [address], [place] or [phone], [place] [phone]) [phone] Phone number for both [place] and [place] locations is the same.  [facility] [address], [place] [phone]   [facility] [place] [address] [place] [address], [place] or [address] [phone]   [facility] [address], [place] [phone]) [phone], phone; [phone], fax Sees patients 1st and 3rd Saturday of every month.  Must not qualify for public or private insurance (i.e. Medicaid, Medicare, [place] Health Choice, Veterans' Benefits)  Household income should be no more than 200% of the poverty level The clinic cannot treat you if you are pregnant or think you are pregnant  Sexually transmitted  diseases are not treated at the clinic.    Dental Care: Organization         Address  Phone  Notes  [facility] [address], [place] [phone]) [phone] Accepts children up to age 38 who are enrolled in [place]icaid or [place] Health Choice; pregnant women with a Medicaid card; and children who have applied for Medicaid or [place] Health Choice, but were declined, whose parents can pay a reduced fee at time of service.  [facility] [place]  [address], [place] [phone]) [phone] Accepts children up to age 38 who are enrolled in [place]icaid or [place] Health Choice; pregnant women with a Medicaid card; and children who have applied for Medicaid or [place] Health Choice, but were declined, whose parents can pay a reduced fee at time of service.  [facility]  [address], [place] [phone]) [phone] Patients are seen by appointment only. Walk-ins are not accepted. [facility] will see patients 38 years of age and older. Monday - Tuesday (8am-5pm) Most Wednesdays (8:30-5pm) $30 per visit, cash only  [facility]  [address], [facility] [phone]) [phone] Patients are seen by appointment only. Walk-ins are not accepted. [facility] will see patients 38 years of age and older. One Wednesday Evening (Monthly: Volunteer Based).  $30 per visit, cash only  [employer] [facility] [facility]  [phone]) [phone] for adults; Children under age 504, call Graduate Pediatric Dentistry at [phone]. Children aged 174-14, please call [phone]) [phone] to request a pediatric application.  Dental services are provided in all areas of dental care including fillings, crowns and bridges, complete and partial dentures, implants, gum treatment, root canals, and extractions. Preventive care is also provided. Treatment is provided to both adults and children. Patients are selected via a  lottery and there is often a waiting list.   [facility] [address], [place]  [phone]) [phone] [email or web address]   [facility] [address], [place], [place] [phone], Ext. [phone] Second and Fourth Thursday of each month, opens at 6:30 AM; Clinic ends at 9 AM.  Patients are seen on a first-come first-served basis, and a limited number are seen during each clinic.   [facility]  [address], [place], [place] [phone]) [phone]   Eligibility Requirements You must have lived in [place], or [place] counties for at least the last three months.   You cannot be eligible for state or federal sponsored National Cityhealthcare insurance, including CIGNAVeterans Administration, [place]icaid, or [employer] EntertainmentMedicare.   You generally cannot be eligible for healthcare insurance through your employer.    How to apply: Eligibility screenings are held every Tuesday and Wednesday afternoon from 1:00 pm until 4:00 pm. You do not need an appointment for the interview!  [facility] [address], [place], [place] [phone]   [facility]  [phone]   [facility]  [phone]   [facility]  [phone]    Behavioral Health Resources in the Community: Intensive Outpatient Programs Organization         Address  Phone  Notes  [facility] [address], [place], [place] [phone]   [facility] [address], [place], [place] [phone]   [facility] [address], [place], [place]  [phone]   [facility] [address],  [place], [place] [phone] or [phone]   Substance Abuse Resources Organization         Address  Phone  Notes  [facility]  [phone]   [facility]  [phone]   [facility]  [phone]   [facility]  [phone]   Residential &  Outpatient Substance Abuse Program  [phone]   Psychological Services Organization         Address  Phone  Notes  [facility]  [phone]- [phone]   [facility]  [phone]   [facility] [address], [place] [phone] or [phone]    Mobile Crisis Teams Organization         Address  Phone  Notes  [facility], Mobile Crisis Care Unit  [phone]   [facility]  [address] [place], [place] [phone]   Doristine LocksSharon DeEsch [address] [place] [phone]    Self-Help/Support Groups Organization         Address  Phone             Notes  [facility] [place] - variety of support groups  336- [phone] Call for more information  Narcotics Anonymous (NA), Caring Services [address], [place]  2 meetings at this location   Statisticianesidential Treatment Programs Organization         Address  Phone  Notes  [facility] [address],    [place]  [phone]   [facility]  [address], [place] [phone], [place], [place] [phone]   [facility] [address], [place] [phone] Admissions: 8am-3pm M-F  [facility] [address]    [place], [place] [phone]   [facility] [address], [place], [place] [phone]   [facility] [address]  [place], [place] [phone]   [facility] - Intensive Outpatient [address], [place], [place] [phone]   [facility] (Addiction Recovery Care Assoc[facility] [address]  [place], [place] [phone] or [phone]   [facility] (RTS) [address] [place], [place] [phone] Accepts Medicaid  [facility] [address]  [place] [phone] Substance Abuse/Addiction Treatment   [facility] Behavioral Health Resources Organization          Address  Phone  Notes  [facility]  [phone]   Angie FavaJulie Brannon, PhD [address], [address] [place], [place]   [phone] or [phone]) [phone]   [facility] [place]   [address] [place], [place] [phone]   [facility] [address], [place], [place] [phone] Insurance/Medicaid/sponsorship through [facility]  Faith and Families [address]                                    [place], [place] [phone]) [phone] Therapy/tele-psych/case  [facility] [address].   [place], [place] [phone]    Dr. Lolly MustacheArfeen  [phone]   [facility]  [employer] [facility] 1) [address], [place] 2) [address], [place] 3)  [address], [place] [phone]  [phone]   [facility] Child Abuse Hotline [phone](336) [phone] or [phone] (After Hours)
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
## 2013-10-18 NOTE — ED Provider Notes (Signed)
Medical screening examination/treatment/procedure(s) were performed by non-physician practitioner and as supervising physician I was immediately available for consultation/collaboration.   EKG Interpretation None      Devoria AlbeIva Manjot Hinks, MD, Armando GangFACEP   Ward GivensIva L Xylan Sheils, MD 10/18/13 [phone]

## 2013-12-14 ENCOUNTER — Encounter (HOSPITAL_COMMUNITY): Payer: Self-pay | Admitting: Emergency Medicine

## 2013-12-14 ENCOUNTER — Emergency Department (HOSPITAL_COMMUNITY)
Admission: EM | Admit: 2013-12-14 | Discharge: 2013-12-14 | Disposition: A | Discharge status: Home / Self Care | Payer: Medicaid - Out of State | Attending: Emergency Medicine | Admitting: Emergency Medicine

## 2013-12-14 DIAGNOSIS — J45909 Unspecified asthma, uncomplicated: Secondary | ICD-10-CM | POA: Insufficient documentation

## 2013-12-14 DIAGNOSIS — Y9389 Activity, other specified: Secondary | ICD-10-CM | POA: Insufficient documentation

## 2013-12-14 DIAGNOSIS — Z791 Long term (current) use of non-steroidal anti-inflammatories (NSAID): Secondary | ICD-10-CM | POA: Diagnosis not present

## 2013-12-14 DIAGNOSIS — Y990 Civilian activity done for income or pay: Secondary | ICD-10-CM | POA: Insufficient documentation

## 2013-12-14 DIAGNOSIS — Z87442 Personal history of urinary calculi: Secondary | ICD-10-CM | POA: Insufficient documentation

## 2013-12-14 DIAGNOSIS — Z79899 Other long term (current) drug therapy: Secondary | ICD-10-CM | POA: Diagnosis not present

## 2013-12-14 DIAGNOSIS — X500XXA Overexertion from strenuous movement or load, initial encounter: Secondary | ICD-10-CM | POA: Insufficient documentation

## 2013-12-14 DIAGNOSIS — I10 Essential (primary) hypertension: Secondary | ICD-10-CM | POA: Diagnosis not present

## 2013-12-14 DIAGNOSIS — S335XXA Sprain of ligaments of lumbar spine, initial encounter: Secondary | ICD-10-CM | POA: Insufficient documentation

## 2013-12-14 DIAGNOSIS — F17200 Nicotine dependence, unspecified, uncomplicated: Secondary | ICD-10-CM | POA: Diagnosis not present

## 2013-12-14 DIAGNOSIS — G8929 Other chronic pain: Secondary | ICD-10-CM | POA: Diagnosis not present

## 2013-12-14 DIAGNOSIS — R061 Stridor: Secondary | ICD-10-CM | POA: Diagnosis not present

## 2013-12-14 DIAGNOSIS — Y9289 Other specified places as the place of occurrence of the external cause: Secondary | ICD-10-CM | POA: Insufficient documentation

## 2013-12-14 DIAGNOSIS — IMO0002 Reserved for concepts with insufficient information to code with codable children: Secondary | ICD-10-CM | POA: Diagnosis present

## 2013-12-14 DIAGNOSIS — S39012A Strain of muscle, fascia and tendon of lower back, initial encounter: Secondary | ICD-10-CM

## 2013-12-14 HISTORY — DX: Essential (primary) hypertension: I10

## 2013-12-14 MED ORDER — TRAMADOL HCL 50 MG PO TABS: 50.0000 mg | ORAL_TABLET | Freq: Four times a day (QID) | ORAL | Status: DC | PRN

## 2013-12-14 MED ORDER — IBUPROFEN 600 MG PO TABS: 600.0000 mg | ORAL_TABLET | Freq: Three times a day (TID) | ORAL | Status: DC

## 2013-12-14 MED ORDER — CYCLOBENZAPRINE HCL 5 MG PO TABS: 5.0000 mg | ORAL_TABLET | Freq: Three times a day (TID) | ORAL | Status: DC | PRN

## 2013-12-14 NOTE — ED Notes (Signed)
Pt ready for d/c when seen by me 
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
## 2013-12-14 NOTE — Discharge Instructions (Signed)
Lumbosacral Strain Lumbosacral strain is a strain of any of the parts that make up your lumbosacral vertebrae. Your lumbosacral vertebrae are the bones that make up the lower third of your backbone. Your lumbosacral vertebrae are held together by muscles and tough, fibrous tissue (ligaments).  CAUSES  A sudden blow to your back can cause lumbosacral strain. Also, anything that causes an excessive stretch of the muscles in the low back can cause this strain. This is typically seen when people exert themselves strenuously, fall, lift heavy objects, bend, or crouch repeatedly. RISK FACTORS  Physically demanding work.  Participation in pushing or pulling sports or sports that require a sudden twist of the back (tennis, golf, baseball).  Weight lifting.  Excessive lower back curvature.  Forward-tilted pelvis.  Weak back or abdominal muscles or both.  Tight hamstrings. SIGNS AND SYMPTOMS  Lumbosacral strain may cause pain in the area of your injury or pain that moves (radiates) down your leg.  DIAGNOSIS Your health care provider can often diagnose lumbosacral strain through a physical exam. In some cases, you may need tests such as X-ray exams.  TREATMENT  Treatment for your lower back injury depends on many factors that your clinician will have to evaluate. However, most treatment will include the use of anti-inflammatory medicines. HOME CARE INSTRUCTIONS   Avoid hard physical activities (tennis, racquetball, waterskiing) if you are not in proper physical condition for it. This may aggravate or create problems.  If you have a back problem, avoid sports requiring sudden body movements. Swimming and walking are generally safer activities.  Maintain good posture.  Maintain a healthy weight.  For acute conditions, you may put ice on the injured area.  Put ice in a plastic bag.  Place a towel between your skin and the bag.  Leave the ice on for 20 minutes, 2-3 times a day.  When the  low back starts healing, stretching and strengthening exercises may be recommended. SEEK MEDICAL CARE IF:  Your back pain is getting worse.  You experience severe back pain not relieved with medicines. SEEK IMMEDIATE MEDICAL CARE IF:   You have numbness, tingling, weakness, or problems with the use of your arms or legs.  There is a change in bowel or bladder control.  You have increasing pain in any area of the body, including your belly (abdomen).  You notice shortness of breath, dizziness, or feel faint.  You feel sick to your stomach (nauseous), are throwing up (vomiting), or become sweaty.  You notice discoloration of your toes or legs, or your feet get very cold. MAKE SURE YOU:   Understand these instructions.  Will watch your condition.  Will get help right away if you are not doing well or get worse. Document Released: 03/03/2005 Document Revised: 05/29/2013 Document Reviewed: 01/10/2013 [facility] Patient Information 2015 [employer] [place]. This information is not intended to replace advice given to you by your health care provider. Make sure you discuss any questions you have with your health care provider.    Do not drive within 4 hours of taking tramadol as this will make you drowsy.  Avoid lifting,  Bending,  Twisting or any other activity that worsens your pain over the next week.  Apply an  icepack  to your lower back for 10-15 minutes every 2 hours for the next 2 days.  You should get rechecked if your symptoms are not better over the next 5 days,  Or you develop increased pain,  Weakness in your leg(s) or  loss of bladder or bowel function - these are symptoms of a worse injury.

## 2013-12-17 NOTE — ED Provider Notes (Signed)
CSN: [id number]     Arrival date & time 12/14/13  1324 History   First MD Initiated Contact with Patient 12/14/13 1329     Chief Complaint  Patient presents with  . Back Pain     (Consider location/radiation/quality/duration/timing/severity/associated sxs/prior Treatment) The history is provided by the patient.    Alan Park is a 38 y.o. male presenting with acute on chronic low back pain which has which has been present since yesterday.  He believes he stirred up his intermittent low back pain while breaking down some metal components at work yesterday.  He denies radiation into his legs.  There has been no weakness or numbness in the lower extremities and no urinary or bowel retention or incontinence.  Patient does not have a history of cancer or IVDU.  The patient has tried the followed medicines and/or treatments without relief of pain: tylenol.   Past Medical History  Diagnosis Date  . Asthma   . Kidney stone   . Kidney stones   . Hypertension    History reviewed. No pertinent past surgical history. History reviewed. No pertinent family history. History  Substance Use Topics  . Smoking status: Current Every Day Smoker -- 1.00 packs/day    Types: Cigarettes  . Smokeless tobacco: Not on file  . Alcohol Use: Yes     Comment: "occassionally"    Review of Systems  Respiratory: Positive for stridor. Negative for shortness of breath.   Cardiovascular: Negative for leg swelling.  Gastrointestinal: Negative for constipation and abdominal distention.  Genitourinary: Negative for urgency, frequency, flank pain and difficulty urinating.  Musculoskeletal: Negative for gait problem and joint swelling.  Skin: Negative for rash.      Allergies  Review of patient's allergies indicates no known allergies.  Home Medications   Prior to Admission medications   Medication Sig Start Date End Date Taking? Authorizing Provider  acetaminophen (TYLENOL) 500 MG tablet Take 1,000  mg by mouth daily as needed. For pain     Historical Provider, MD  cyclobenzaprine (FLEXERIL) 10 MG tablet Take 1 tablet (10 mg total) by mouth 3 (three) times daily. 05/23/13   Kathie Dike, PA-C  cyclobenzaprine (FLEXERIL) 5 MG tablet Take 1 tablet (5 mg total) by mouth 3 (three) times daily as needed for muscle spasms. 12/14/13   Burgess Amor, PA-C  dexamethasone (DECADRON) 6 MG tablet 1 po bid with food 05/25/13   Kathie Dike, PA-C  HYDROcodone-acetaminophen (NORCO/VICODIN) 5-325 MG per tablet Take 1 tablet by mouth every 4 (four) hours as needed. 10/17/13   Burgess Amor, PA-C  ibuprofen (ADVIL,MOTRIN) 600 MG tablet Take 1 tablet (600 mg total) by mouth 3 (three) times daily. 12/14/13   Burgess Amor, PA-C  ibuprofen (ADVIL,MOTRIN) 800 MG tablet Take 1 tablet (800 mg total) by mouth 3 (three) times daily. 05/23/13   Kathie Dike, PA-C  lisinopril-hydrochlorothiazide (ZESTORETIC) 10-12.5 MG per tablet Take 1 tablet by mouth daily. 10/17/13   Burgess Amor, PA-C  PRESCRIPTION MEDICATION Take 1 tablet by mouth daily. Blood pressure medication    Historical Provider, MD  traMADol (ULTRAM) 50 MG tablet 1 or 2 po q6h prn pain 05/25/13   Kathie Dike, PA-C  traMADol (ULTRAM) 50 MG tablet Take 1 tablet (50 mg total) by mouth every 6 (six) hours as needed. 12/14/13   Burgess Amor, PA-C   BP 124/78  Pulse 102  Temp(Src) 97.7 F (36.5 C) (Oral)  Resp 20  Ht 5\' 8"  (1.727 m)  Wt 150 lb (68.04 kg)  BMI 22.81 kg/m2  SpO2 100% Physical Exam  Nursing note and vitals reviewed. Constitutional: He appears well-developed and well-nourished.  HENT:  Head: Normocephalic.  Eyes: Conjunctivae are normal.  Neck: Normal range of motion. Neck supple.  Cardiovascular: Normal rate and intact distal pulses.   Pedal pulses normal.  Pulmonary/Chest: Effort normal.  Abdominal: Soft. Bowel sounds are normal. He exhibits no distension and no mass.  Musculoskeletal: Normal range of motion. He exhibits tenderness. He  exhibits no edema.       Lumbar back: He exhibits tenderness. He exhibits no swelling, no edema and no spasm.  Bilateral paralumbar ttp.  No midline pain.  Neurological: He is alert. He has normal strength. He displays no atrophy and no tremor. No sensory deficit. Gait normal.  Reflex Scores:      Patellar reflexes are 2+ on the right side and 2+ on the left side.      Achilles reflexes are 2+ on the right side and 2+ on the left side. No strength deficit noted in hip and knee flexor and extensor muscle groups.  Ankle flexion and extension intact.  Skin: Skin is warm and dry.  Psychiatric: He has a normal mood and affect.    ED Course  Procedures (including critical care time) Labs Review Labs Reviewed - No data to display  Imaging Review No results found.   EKG Interpretation None      MDM   Final diagnoses:  Lumbar strain, initial encounter    No neuro deficit on exam or by history to suggest emergent or surgical presentation.  Also discussed worsened sx that should prompt immediate re-evaluation including distal weakness, bowel/bladder retention/incontinence.  Pt placed on flexil, ibuprofen, tramadol.  Heat tx,  Referrals given for recheck and for establishing pcp.        Burgess AmorJulie Shawnette Augello, PA-C 12/17/13 1323

## 2013-12-20 NOTE — ED Provider Notes (Signed)
Medical screening examination/treatment/procedure(s) were performed by non-physician practitioner and as supervising physician I was immediately available for consultation/collaboration.   EKG Interpretation None       Nakea Gouger, MD 12/20/13 1905 

## 2014-03-04 ENCOUNTER — Emergency Department (HOSPITAL_COMMUNITY)
Admission: EM | Admit: 2014-03-04 | Discharge: 2014-03-04 | Disposition: A | Discharge status: Home / Self Care | Payer: Medicaid - Out of State | Attending: Emergency Medicine | Admitting: Emergency Medicine

## 2014-03-04 ENCOUNTER — Emergency Department (HOSPITAL_COMMUNITY): Payer: Medicaid - Out of State

## 2014-03-04 ENCOUNTER — Encounter (HOSPITAL_COMMUNITY): Payer: Self-pay | Admitting: Emergency Medicine

## 2014-03-04 DIAGNOSIS — F17200 Nicotine dependence, unspecified, uncomplicated: Secondary | ICD-10-CM | POA: Insufficient documentation

## 2014-03-04 DIAGNOSIS — S59919A Unspecified injury of unspecified forearm, initial encounter: Secondary | ICD-10-CM

## 2014-03-04 DIAGNOSIS — Z87442 Personal history of urinary calculi: Secondary | ICD-10-CM | POA: Diagnosis not present

## 2014-03-04 DIAGNOSIS — S6990XA Unspecified injury of unspecified wrist, hand and finger(s), initial encounter: Secondary | ICD-10-CM

## 2014-03-04 DIAGNOSIS — J45909 Unspecified asthma, uncomplicated: Secondary | ICD-10-CM | POA: Insufficient documentation

## 2014-03-04 DIAGNOSIS — Y9389 Activity, other specified: Secondary | ICD-10-CM | POA: Insufficient documentation

## 2014-03-04 DIAGNOSIS — Y929 Unspecified place or not applicable: Secondary | ICD-10-CM | POA: Diagnosis not present

## 2014-03-04 DIAGNOSIS — S63501A Unspecified sprain of right wrist, initial encounter: Secondary | ICD-10-CM

## 2014-03-04 DIAGNOSIS — S59909A Unspecified injury of unspecified elbow, initial encounter: Secondary | ICD-10-CM | POA: Diagnosis present

## 2014-03-04 DIAGNOSIS — Z79899 Other long term (current) drug therapy: Secondary | ICD-10-CM | POA: Insufficient documentation

## 2014-03-04 DIAGNOSIS — S63509A Unspecified sprain of unspecified wrist, initial encounter: Secondary | ICD-10-CM | POA: Diagnosis not present

## 2014-03-04 DIAGNOSIS — I10 Essential (primary) hypertension: Secondary | ICD-10-CM | POA: Insufficient documentation

## 2014-03-04 DIAGNOSIS — W108XXA Fall (on) (from) other stairs and steps, initial encounter: Secondary | ICD-10-CM | POA: Insufficient documentation

## 2014-03-04 MED ORDER — IBUPROFEN 600 MG PO TABS: 600.0000 mg | ORAL_TABLET | Freq: Four times a day (QID) | ORAL | Status: DC | PRN

## 2014-03-04 MED ORDER — IBUPROFEN 800 MG PO TABS
800.0000 mg | ORAL_TABLET | Freq: Once | ORAL | Status: AC
  Administered 2014-03-04: 800 mg via ORAL
  Filled 2014-03-04: qty 1

## 2014-03-04 NOTE — ED Notes (Signed)
Rt wrist pain, fell on wet steps this am.  Good radial pulse and distal sensation.  Ice pack applied

## 2014-03-04 NOTE — ED Notes (Signed)
Fell outside onto rt wrist. Nod noted.

## 2014-03-04 NOTE — Discharge Instructions (Signed)
Sprain A sprain happens when the bands of tissue that connect bones and hold joints together (ligaments) stretch too much or tear. HOME CARE  Raise (elevate) the injured area to lessen puffiness (swelling).  Put ice on the injured area 2 times a day for 2-3 days.  Put ice in a plastic bag.  Place a towel between your skin and the bag.  Leave the ice on for 15 minutes.  Only take medicine as told by your doctor.  Protect your injured area until your pain and stiffness go away.  Do not get your cast or splint wet. Cover your cast or splint with a plastic bag when you shower or take a bath. Do not swim in a pool.  Your doctor may suggest exercises during your recovery to keep from getting stiff. GET HELP RIGHT AWAY IF:   Your cast or splint becomes damaged.  Your pain gets worse. MAKE SURE YOU:   Understand these instructions.  Will watch this condition.  Will get help right away if you are not doing well or get worse. Document Released: 11/10/2007 Document Revised: 03/14/2013 Document Reviewed: 06/05/2011 [facility] Patient Information 2015 [place], [place]. This information is not intended to replace advice given to you by your health care provider. Make sure you discuss any questions you have with your health care provider.  Your x-rays today are negative for any bony injury or dislocation.  Wear the  the wrist splint for comfort until your symptoms are better.  Continue to use ice and elevation for the next 2 days.  Use ibuprofen to help with pain and inflammation.  Call Dr. Romeo Apple for further evaluation if your symptoms persist beyond the next 7-10 days.

## 2014-03-04 NOTE — ED Provider Notes (Signed)
CSN: [id number]     Arrival date & time 03/04/14  1120 History   First MD Initiated Contact with Patient 03/04/14 1341    This chart was scribed for non-physician practitioner Burgess Amor, PA-C, working with Joya Gaskins, MD by Gwenevere Abbot, ED scribe. This patient was seen in room APFT23/APFT23 and the patient's care was started at 2:34 PM.  Chief Complaint  Patient presents with  . Wrist Pain    The history is provided by the patient. No language interpreter was used.   HPI Comments:  Alan Park is a 38 y.o. male who presents to the Emergency Department complaining of a right wrist pain, after falling down a 1/2 flight of steps when his feet slipped in water. Pt reports that he caught himself with his right arm out behind him, and is currently experiencing throbbing pain in the right wrist.  He denies weakness or numbness distal to the painful site and denies radiation of pain, no elbow or upper arm pain.  Denies other injury including head, neck pain. Pt reports that he has used ice to modify pain with mild relief.   Past Medical History  Diagnosis Date  . Asthma   . Kidney stone   . Kidney stones   . Hypertension    History reviewed. No pertinent past surgical history. No family history on file. History  Substance Use Topics  . Smoking status: Current Every Day Smoker -- 1.00 packs/day    Types: Cigarettes  . Smokeless tobacco: Not on file  . Alcohol Use: Yes     Comment: "occassionally"    Review of Systems  Constitutional: Negative for fever.  Musculoskeletal: Positive for arthralgias and joint swelling. Negative for myalgias.  Neurological: Negative for weakness and numbness.    Allergies  Review of patient's allergies indicates no known allergies.  Home Medications   Prior to Admission medications   Medication Sig Start Date End Date Taking? Authorizing Provider  acetaminophen (TYLENOL) 500 MG tablet Take 1,000 mg by mouth daily as needed. For pain     Yes Historical Provider, MD  lisinopril-hydrochlorothiazide (ZESTORETIC) 10-12.5 MG per tablet Take 1 tablet by mouth daily. 10/17/13  Yes Burgess Amor, PA-C  ibuprofen (ADVIL,MOTRIN) 600 MG tablet Take 1 tablet (600 mg total) by mouth every 6 (six) hours as needed. 03/04/14   Burgess Amor, PA-C   BP 155/97  Pulse 108  Temp(Src) 98.9 F (37.2 C) (Oral)  Resp 16  SpO2 97% Physical Exam  Constitutional: He appears well-developed and well-nourished.  HENT:  Head: Atraumatic.  Neck: Normal range of motion.  Cardiovascular:  Pulses equal bilaterally  Musculoskeletal: He exhibits tenderness.  Tenderness to paplpation along his right lateral distal wrist. No deformity or hematoma. No snuff box tenderness. Pt has full strength of digits with flexion and extension. Normal sensation distally. Less than 3 second cap refill. Elbow, shoulder, and clavicle all non-tender.   Neurological: He is alert. He has normal strength. He displays normal reflexes. No sensory deficit.  Skin: Skin is warm and dry.  Psychiatric: He has a normal mood and affect.    ED Course  Procedures  DIAGNOSTIC STUDIES: Oxygen Saturation is 97% on RA, normal by my interpretation.  COORDINATION OF CARE:   Labs Review Labs Reviewed - No data to display  Imaging Review No results found.   EKG Interpretation None      MDM   Final diagnoses:  Wrist sprain, right, initial encounter    Patients labs and/or  radiological studies were viewed and considered during the medical decision making and disposition process. No fracture, no deformity, no weakness or decreased sensation.  Pt placed in velcro wrist splint.  RICE, ibprofen.  F/u with ortho for recheck if not improving over the next 7-10 days.    The patient appears reasonably screened and/or stabilized for discharge and I doubt any other medical condition or other [facility] requiring further screening, evaluation, or treatment in the ED at this time prior to  discharge.   I personally performed the services described in this documentation, which was scribed in my presence. The recorded information has been reviewed and is accurate.   Burgess Amor, PA-C 03/06/14 1339

## 2014-03-06 NOTE — ED Provider Notes (Signed)
Medical screening examination/treatment/procedure(s) were performed by non-physician practitioner and as supervising physician I was immediately available for consultation/collaboration.   EKG Interpretation None        Laterrian Hevener W Augusto Deckman, MD 03/06/14 1410 

## 2014-05-23 ENCOUNTER — Encounter (HOSPITAL_COMMUNITY): Payer: Self-pay | Admitting: Emergency Medicine

## 2014-05-23 ENCOUNTER — Emergency Department (HOSPITAL_COMMUNITY)
Admission: EM | Admit: 2014-05-23 | Discharge: 2014-05-23 | Disposition: A | Discharge status: Home / Self Care | Payer: Medicaid - Out of State | Attending: Emergency Medicine | Admitting: Emergency Medicine

## 2014-05-23 DIAGNOSIS — Z720 Tobacco use: Secondary | ICD-10-CM | POA: Insufficient documentation

## 2014-05-23 DIAGNOSIS — Z79899 Other long term (current) drug therapy: Secondary | ICD-10-CM | POA: Insufficient documentation

## 2014-05-23 DIAGNOSIS — I10 Essential (primary) hypertension: Secondary | ICD-10-CM | POA: Insufficient documentation

## 2014-05-23 DIAGNOSIS — J45909 Unspecified asthma, uncomplicated: Secondary | ICD-10-CM | POA: Insufficient documentation

## 2014-05-23 DIAGNOSIS — Z87442 Personal history of urinary calculi: Secondary | ICD-10-CM | POA: Insufficient documentation

## 2014-05-23 DIAGNOSIS — K047 Periapical abscess without sinus: Secondary | ICD-10-CM

## 2014-05-23 MED ORDER — AMOXICILLIN 500 MG PO CAPS: 500.0000 mg | ORAL_CAPSULE | Freq: Three times a day (TID) | ORAL | Status: AC

## 2014-05-23 MED ORDER — TRAMADOL HCL 50 MG PO TABS: 50.0000 mg | ORAL_TABLET | Freq: Four times a day (QID) | ORAL | Status: DC | PRN

## 2014-05-23 NOTE — ED Notes (Signed)
Patient c/o right lower dental pain x2 days. Poor dental hygiene with multiple cavities and missing teeth noted. Patient reports taking BC powder and tylenol with no relief.

## 2014-05-23 NOTE — Discharge Instructions (Signed)
Dental Care and Dentist Visits Dental care supports good overall health. Regular dental visits can also help you avoid dental pain, bleeding, infection, and other more serious health problems in the future. It is important to keep the mouth healthy because diseases in the teeth, gums, and other oral tissues can spread to other areas of the body. Some problems, such as diabetes, heart disease, and pre-term labor have been associated with poor oral health.  See your dentist every 6 months. If you experience emergency problems such as a toothache or broken tooth, go to the dentist right away. If you see your dentist regularly, you may catch problems early. It is easier to be treated for problems in the early stages.  WHAT TO EXPECT AT A DENTIST VISIT  Your dentist will look for many common oral health problems and recommend proper treatment. At your regular dental visit, you can expect:  Gentle cleaning of the teeth and gums. This includes scraping and polishing. This helps to remove the sticky substance around the teeth and gums (plaque). Plaque forms in the mouth shortly after eating. Over time, plaque hardens on the teeth as tartar. If tartar is not removed regularly, it can cause problems. Cleaning also helps remove stains.  Periodic X-rays. These pictures of the teeth and supporting bone will help your dentist assess the health of your teeth.  Periodic fluoride treatments. Fluoride is a natural mineral shown to help strengthen teeth. Fluoride treatmentinvolves applying a fluoride gel or varnish to the teeth. It is most commonly done in children.  Examination of the mouth, tongue, jaws, teeth, and gums to look for any oral health problems, such as:  Cavities (dental caries). This is decay on the tooth caused by plaque, sugar, and acid in the mouth. It is best to catch a cavity when it is small.  Inflammation of the gums caused by plaque buildup (gingivitis).  Problems with the mouth or malformed  or misaligned teeth.  Oral cancer or other diseases of the soft tissues or jaws. KEEP YOUR TEETH AND GUMS HEALTHY For healthy teeth and gums, follow these general guidelines as well as your dentist's specific advice:  Have your teeth professionally cleaned at the dentist every 6 months.  Brush twice daily with a fluoride toothpaste.  Floss your teeth daily.  Ask your dentist if you need fluoride supplements, treatments, or fluoride toothpaste.  Eat a healthy diet. Reduce foods and drinks with added sugar.  Avoid smoking. TREATMENT FOR ORAL HEALTH PROBLEMS If you have oral health problems, treatment varies depending on the conditions present in your teeth and gums.  Your caregiver will most likely recommend good oral hygiene at each visit.  For cavities, gingivitis, or other oral health disease, your caregiver will perform a procedure to treat the problem. This is typically done at a separate appointment. Sometimes your caregiver will refer you to another dental specialist for specific tooth problems or for surgery. SEEK IMMEDIATE DENTAL CARE IF:  You have pain, bleeding, or soreness in the gum, tooth, jaw, or mouth area.  A permanent tooth becomes loose or separated from the gum socket.  You experience a blow or injury to the mouth or jaw area. Document Released: 02/03/2011 Document Revised: 08/16/2011 Document Reviewed: 02/03/2011 [facility] Patient Information 2015 [place], [place]. This information is not intended to replace advice given to you by your health care provider. Make sure you discuss any questions you have with your health care provider.   Complete your entire course of antibiotics as  prescribed.  You  may use the tramadol for pain relief but do not drive within 4 hours of taking as this will make you drowsy.  Avoid applying heat or ice to this abscess area which can worsen your symptoms.  You may use warm salt water swish and spit treatment or half peroxide and water  swish and spit after meals to keep this area clean as discussed.  Call the dentist listed above for further management of your symptoms.

## 2014-05-23 NOTE — ED Provider Notes (Signed)
CSN: [id number]     Arrival date & time 05/23/14  1021 History   First MD Initiated Contact with Patient 05/23/14 1032     Chief Complaint  Patient presents with  . Dental Pain     (Consider location/radiation/quality/duration/timing/severity/associated sxs/prior Treatment) The history is provided by the patient.   Alan Park is a 38 y.o. male presenting with a 2  day history of dental pain and gingival swelling.   The patient has a history of severe in the teeth involved which has recently started to cause increased  Pain and is now developing swelling along his gingiva.  There has been no fevers, chills, nausea or vomiting, also no complaint of difficulty swallowing, although chewing makes pain worse.  The patient has tried bc powders without relief of symptoms.  He used to [facility] [place] prior to loosing [place] medicaid.  He had his teeth removed entirely except for the remaining lower anterior teeth.  Now that he has his medicaid back he is motivated to get the remaining teeth pulled so he can get dentures.       Past Medical History  Diagnosis Date  . Asthma   . Kidney stone   . Kidney stones   . Hypertension    No past surgical history on file. History reviewed. No pertinent family history. History  Substance Use Topics  . Smoking status: Current Every Day Smoker -- 1.00 packs/day for 10 years    Types: Cigarettes  . Smokeless tobacco: Never Used  . Alcohol Use: Yes     Comment: "occassionally"    Review of Systems  Constitutional: Negative for fever.  HENT: Positive for dental problem. Negative for facial swelling and sore throat.   Respiratory: Negative for shortness of breath.   Musculoskeletal: Negative for neck pain and neck stiffness.      Allergies  Review of patient's allergies indicates no known allergies.  Home Medications   Prior to Admission medications   Medication Sig Start Date End Date Taking? Authorizing Provider   acetaminophen (TYLENOL) 500 MG tablet Take 1,000 mg by mouth daily as needed. For pain     Historical Provider, MD  amoxicillin (AMOXIL) 500 MG capsule Take 1 capsule (500 mg total) by mouth 3 (three) times daily. 05/23/14 06/02/14  Burgess AmorJulie Emyah Roznowski, PA-C  ibuprofen (ADVIL,MOTRIN) 600 MG tablet Take 1 tablet (600 mg total) by mouth every 6 (six) hours as needed. 03/04/14   Burgess AmorJulie Racer Quam, PA-C  lisinopril-hydrochlorothiazide (ZESTORETIC) 10-12.5 MG per tablet Take 1 tablet by mouth daily. 10/17/13   Burgess AmorJulie Necha Harries, PA-C  traMADol (ULTRAM) 50 MG tablet Take 1 tablet (50 mg total) by mouth every 6 (six) hours as needed. 05/23/14   Burgess AmorJulie Marques Ericson, PA-C   BP 154/95 mmHg  Pulse 110  Temp(Src) 97.7 F (36.5 C) (Oral)  Resp 18  Ht 5\' 8"  (1.727 m)  Wt 150 lb (68.04 kg)  BMI 22.81 kg/m2  SpO2 100% Physical Exam  Constitutional: He is oriented to person, place, and time. He appears well-developed and well-nourished. No distress.  HENT:  Head: Normocephalic and atraumatic.  Right Ear: Tympanic membrane and external ear normal.  Left Ear: Tympanic membrane and external ear normal.  Mouth/Throat: Oropharynx is clear and moist and mucous membranes are normal. No oral lesions. No trismus in the jaw. Dental abscesses present.  Poor dentition in remaining lower anterior central and lateral incisors.  Decay, anterior gingival edema and erythema without pus pocket.    Eyes: Conjunctivae are  normal.  Neck: Normal range of motion. Neck supple.  Cardiovascular: Normal rate and normal heart sounds.   Pulmonary/Chest: Effort normal.  Abdominal: He exhibits no distension.  Musculoskeletal: Normal range of motion.  Lymphadenopathy:    He has no cervical adenopathy.  Neurological: He is alert and oriented to person, place, and time.  Skin: Skin is warm and dry. No erythema.  Psychiatric: He has a normal mood and affect.    ED Course  Procedures (including critical care time) Labs Review Labs Reviewed - No data to  display  Imaging Review No results found.   EKG Interpretation None      MDM   Final diagnoses:  Dental infection    Amoxil, tramadol, dental referral list given or f/u with prior dentist with patient agrees.  The patient appears reasonably screened and/or stabilized for discharge and I doubt any other medical condition or other [facility] requiring further screening, evaluation, or treatment in the ED at this time prior to discharge.     Burgess AmorJulie Jaslene Marsteller, PA-C 05/23/14 1100  Joya Gaskinsonald W Wickline, MD 05/23/14 1600

## 2014-08-24 ENCOUNTER — Encounter (HOSPITAL_COMMUNITY): Payer: Self-pay | Admitting: Cardiology

## 2014-08-24 ENCOUNTER — Emergency Department (HOSPITAL_COMMUNITY)
Admission: EM | Admit: 2014-08-24 | Discharge: 2014-08-24 | Disposition: A | Discharge status: Home / Self Care | Payer: Medicaid Other | Attending: Emergency Medicine | Admitting: Emergency Medicine

## 2014-08-24 DIAGNOSIS — Z79899 Other long term (current) drug therapy: Secondary | ICD-10-CM | POA: Insufficient documentation

## 2014-08-24 DIAGNOSIS — I10 Essential (primary) hypertension: Secondary | ICD-10-CM | POA: Diagnosis not present

## 2014-08-24 DIAGNOSIS — K088 Other specified disorders of teeth and supporting structures: Secondary | ICD-10-CM | POA: Diagnosis present

## 2014-08-24 DIAGNOSIS — Z87442 Personal history of urinary calculi: Secondary | ICD-10-CM | POA: Diagnosis not present

## 2014-08-24 DIAGNOSIS — R000 Tachycardia, unspecified: Secondary | ICD-10-CM | POA: Diagnosis not present

## 2014-08-24 DIAGNOSIS — J45909 Unspecified asthma, uncomplicated: Secondary | ICD-10-CM | POA: Diagnosis not present

## 2014-08-24 DIAGNOSIS — Z720 Tobacco use: Secondary | ICD-10-CM | POA: Diagnosis not present

## 2014-08-24 DIAGNOSIS — K0889 Other specified disorders of teeth and supporting structures: Secondary | ICD-10-CM

## 2014-08-24 MED ORDER — KETOROLAC TROMETHAMINE 10 MG PO TABS
10.0000 mg | ORAL_TABLET | Freq: Once | ORAL | Status: AC
  Administered 2014-08-24: 10 mg via ORAL
  Filled 2014-08-24: qty 1

## 2014-08-24 MED ORDER — CLINDAMYCIN HCL 150 MG PO CAPS
300.0000 mg | ORAL_CAPSULE | Freq: Once | ORAL | Status: AC
  Administered 2014-08-24: 300 mg via ORAL
  Filled 2014-08-24: qty 2

## 2014-08-24 MED ORDER — CELECOXIB 100 MG PO CAPS: 100.0000 mg | ORAL_CAPSULE | Freq: Two times a day (BID) | ORAL | Status: DC

## 2014-08-24 MED ORDER — ACETAMINOPHEN 325 MG PO TABS
650.0000 mg | ORAL_TABLET | Freq: Once | ORAL | Status: AC
  Administered 2014-08-24: 650 mg via ORAL
  Filled 2014-08-24: qty 2

## 2014-08-24 MED ORDER — CLINDAMYCIN HCL 150 MG PO CAPS
ORAL_CAPSULE | ORAL | Status: DC
Start: 2014-08-24 — End: 2015-04-06

## 2014-08-24 NOTE — ED Provider Notes (Signed)
CSN: [id number]     Arrival date & time 08/24/14  1723 History   First MD Initiated Contact with Patient 08/24/14 1901     Chief Complaint  Patient presents with  . Dental Pain     (Consider location/radiation/quality/duration/timing/severity/associated sxs/prior Treatment) Patient is a 39 y.o. male presenting with tooth pain. The history is provided by the patient.  Dental Pain Location:  Lower Quality:  Aching Severity:  Moderate Onset quality:  Gradual Duration:  1 day Timing:  Intermittent Progression:  Worsening Chronicity:  Chronic Context: poor dentition   Relieved by:  Nothing Ineffective treatments:  Acetaminophen Associated symptoms: gum swelling   Associated symptoms: no difficulty swallowing, no drooling, no fever and no neck pain   Risk factors: lack of dental care and smoking     Past Medical History  Diagnosis Date  . Asthma   . Kidney stone   . Kidney stones   . Hypertension    History reviewed. No pertinent past surgical history. History reviewed. No pertinent family history. History  Substance Use Topics  . Smoking status: Current Every Day Smoker -- 1.00 packs/day for 10 years    Types: Cigarettes  . Smokeless tobacco: Never Used  . Alcohol Use: Yes     Comment: "occassionally"    Review of Systems  Constitutional: Negative for fever and activity change.       All ROS Neg except as noted in HPI  HENT: Positive for dental problem. Negative for drooling and nosebleeds.   Eyes: Negative for photophobia and discharge.  Respiratory: Negative for cough, shortness of breath and wheezing.   Cardiovascular: Negative for chest pain and palpitations.  Gastrointestinal: Negative for abdominal pain and blood in stool.  Genitourinary: Negative for dysuria, frequency and hematuria.  Musculoskeletal: Negative for back pain, arthralgias and neck pain.  Skin: Negative.   Neurological: Negative for dizziness, seizures and speech difficulty.   Psychiatric/Behavioral: Negative for hallucinations and confusion.      Allergies  Review of patient's allergies indicates no known allergies.  Home Medications   Prior to Admission medications   Medication Sig Start Date End Date Taking? Authorizing Provider  acetaminophen (TYLENOL) 500 MG tablet Take 1,000 mg by mouth daily as needed. For pain    Yes Historical Provider, MD  Aspirin-Salicylamide-Caffeine (BC HEADACHE POWDER PO) Take 1 packet by mouth daily as needed (dental pain).   Yes Historical Provider, MD  ibuprofen (ADVIL,MOTRIN) 200 MG tablet Take 400-600 mg by mouth every 6 (six) hours as needed for moderate pain.   Yes Historical Provider, MD  lisinopril-hydrochlorothiazide (ZESTORETIC) 10-12.5 MG per tablet Take 1 tablet by mouth daily. 10/17/13  Yes Burgess AmorJulie Idol, PA-C  ibuprofen (ADVIL,MOTRIN) 600 MG tablet Take 1 tablet (600 mg total) by mouth every 6 (six) hours as needed. Patient not taking: Reported on 08/24/2014 03/04/14   Burgess AmorJulie Idol, PA-C  traMADol (ULTRAM) 50 MG tablet Take 1 tablet (50 mg total) by mouth every 6 (six) hours as needed. Patient not taking: Reported on 08/24/2014 05/23/14   Burgess AmorJulie Idol, PA-C   BP 130/90 mmHg  Pulse 110  Temp(Src) 98.5 F (36.9 C) (Oral)  Ht 5\' 8"  (1.727 m)  Wt 150 lb (68.04 kg)  BMI 22.81 kg/m2  SpO2 100% Physical Exam  Constitutional: He is oriented to person, place, and time. He appears well-developed and well-nourished.  Non-toxic appearance.  HENT:  Head: Normocephalic.  Right Ear: Tympanic membrane and external ear normal.  Left Ear: Tympanic membrane and external ear normal.  Several teeth have been extracted. There is noted dental caries of the central incisors of the lower jaw. There is mild swelling of the gum, but no visible abscess present. There is no swelling under the tongue. The airway is patent.  Eyes: EOM and lids are normal. Pupils are equal, round, and reactive to light.  Neck: Normal range of motion. Neck supple.  Carotid bruit is not present.  Cardiovascular: Regular rhythm, normal heart sounds, intact distal pulses and normal pulses.  Tachycardia present.   Pulmonary/Chest: Breath sounds normal. No respiratory distress.  Abdominal: Soft. Bowel sounds are normal. There is no tenderness. There is no guarding.  Musculoskeletal: Normal range of motion.  Lymphadenopathy:       Head (right side): No submandibular adenopathy present.       Head (left side): No submandibular adenopathy present.    He has no cervical adenopathy.  Neurological: He is alert and oriented to person, place, and time. He has normal strength. No cranial nerve deficit or sensory deficit.  Skin: Skin is warm and dry.  Psychiatric: He has a normal mood and affect. His speech is normal.  Nursing note and vitals reviewed.   ED Course  Procedures (including critical care time) Labs Review Labs Reviewed - No data to display  Imaging Review No results found.   EKG Interpretation None      MDM Vital signs stable except for elevated heart rate.  Pt has a tachycardia, but states he almost always has fast heart rate. He has a dental appointment on Wed. 3/23. Rx for clindamycin and celebrex given to the patient.   Final diagnoses:  None    *I have reviewed nursing notes, vital signs, and all appropriate lab and imaging results for this patient.[address], PA-C 08/27/14 1357  Eber Hong, MD 08/28/14 1002

## 2014-08-24 NOTE — Discharge Instructions (Signed)
Dental Pain USE CELEBREX TWO TIMES DAILY. USE TYLENOL BETWEEN DOSES. USE CLINDAMYCIN THREE TIMES DAILY WITH FOOD. SEE YOUR DENTIST AS SCHEDULED.                                                        Toothache is pain in or around a tooth. It may get worse with chewing or with cold or heat.  HOME CARE  Your dentist may use a numbing medicine during treatment. If so, you may need to avoid eating until the medicine wears off. Ask your dentist about this.  Only take medicine as told by your dentist or doctor.  Avoid chewing food near the painful tooth until after all treatment is done. Ask your dentist about this. GET HELP RIGHT AWAY IF:   The problem gets worse or new problems appear.  You have a fever.  There is redness and puffiness (swelling) of the face, jaw, or neck.  You cannot open your mouth.  There is pain in the jaw.  There is very bad pain that is not helped by medicine. MAKE SURE YOU:   Understand these instructions.  Will watch your condition.  Will get help right away if you are not doing well or get worse. Document Released: 11/10/2007 Document Revised: 08/16/2011 Document Reviewed: 11/10/2007 [facility] Patient Information 2015 [place], [place]. This information is not intended to replace advice given to you by your health care provider. Make sure you discuss any questions you have with your health care provider.

## 2014-08-24 NOTE — ED Notes (Signed)
Patient with no complaints at this time. Respirations even and unlabored. Skin warm/dry. Discharge instructions reviewed with patient at this time. Patient given opportunity to voice concerns/ask questions. Patient discharged at this time and left Emergency Department with steady gait.   

## 2014-08-24 NOTE — ED Notes (Signed)
Dental pain since yesterday.  

## 2014-09-30 ENCOUNTER — Encounter (HOSPITAL_COMMUNITY): Payer: Self-pay | Admitting: Emergency Medicine

## 2014-09-30 ENCOUNTER — Emergency Department (HOSPITAL_COMMUNITY)
Admission: EM | Admit: 2014-09-30 | Discharge: 2014-09-30 | Discharge status: Against Medical Advice | Payer: Medicaid Other | Source: Home / Self Care

## 2014-09-30 DIAGNOSIS — Z720 Tobacco use: Secondary | ICD-10-CM | POA: Insufficient documentation

## 2014-09-30 DIAGNOSIS — J45909 Unspecified asthma, uncomplicated: Secondary | ICD-10-CM

## 2014-09-30 DIAGNOSIS — Z79899 Other long term (current) drug therapy: Secondary | ICD-10-CM | POA: Diagnosis not present

## 2014-09-30 DIAGNOSIS — I10 Essential (primary) hypertension: Secondary | ICD-10-CM | POA: Insufficient documentation

## 2014-09-30 DIAGNOSIS — R109 Unspecified abdominal pain: Secondary | ICD-10-CM | POA: Insufficient documentation

## 2014-09-30 DIAGNOSIS — Z791 Long term (current) use of non-steroidal anti-inflammatories (NSAID): Secondary | ICD-10-CM | POA: Insufficient documentation

## 2014-09-30 DIAGNOSIS — N200 Calculus of kidney: Secondary | ICD-10-CM | POA: Insufficient documentation

## 2014-09-30 DIAGNOSIS — Z87442 Personal history of urinary calculi: Secondary | ICD-10-CM | POA: Diagnosis not present

## 2014-09-30 NOTE — ED Notes (Signed)
Pt reports L sided flank pain, states he passed a small stone last night but has continued to have pain. Pt has prior hx of kidney stones.

## 2014-09-30 NOTE — ED Notes (Signed)
Third call for room placement.  No response.

## 2014-09-30 NOTE — ED Notes (Signed)
Pt called twice for room placement. No response 

## 2014-10-01 ENCOUNTER — Encounter (HOSPITAL_COMMUNITY): Payer: Self-pay | Admitting: Emergency Medicine

## 2014-10-01 ENCOUNTER — Emergency Department (HOSPITAL_COMMUNITY)
Admission: EM | Admit: 2014-10-01 | Discharge: 2014-10-01 | Disposition: A | Discharge status: Home / Self Care | Payer: Medicaid Other | Attending: Emergency Medicine | Admitting: Emergency Medicine

## 2014-10-01 ENCOUNTER — Emergency Department (HOSPITAL_COMMUNITY): Payer: Medicaid Other

## 2014-10-01 DIAGNOSIS — R109 Unspecified abdominal pain: Secondary | ICD-10-CM

## 2014-10-01 DIAGNOSIS — N200 Calculus of kidney: Secondary | ICD-10-CM

## 2014-10-01 LAB — URINALYSIS, ROUTINE W REFLEX MICROSCOPIC
Bilirubin Urine: NEGATIVE
Glucose, UA: NEGATIVE mg/dL
HGB URINE DIPSTICK: NEGATIVE
KETONES UR: NEGATIVE mg/dL
Leukocytes, UA: NEGATIVE
Nitrite: NEGATIVE
PH: 5.5 (ref 5.0–8.0)
Protein, ur: NEGATIVE mg/dL
SPECIFIC GRAVITY, URINE: 1.01 (ref 1.005–1.030)
UROBILINOGEN UA: 0.2 mg/dL (ref 0.0–1.0)

## 2014-10-01 MED ORDER — TRAMADOL HCL 50 MG PO TABS: 50.0000 mg | ORAL_TABLET | Freq: Four times a day (QID) | ORAL | Status: DC | PRN

## 2014-10-01 MED ORDER — TRAMADOL HCL 50 MG PO TABS
50.0000 mg | ORAL_TABLET | Freq: Once | ORAL | Status: AC
  Administered 2014-10-01: 50 mg via ORAL
  Filled 2014-10-01: qty 1

## 2014-10-01 NOTE — ED Notes (Signed)
Pt c/o left flank pain since Sunday.

## 2014-10-01 NOTE — Discharge Instructions (Signed)
Take ibuprofen and acetaminophen as needed for pain. Take Tramadol only for pain not relieved by ibuprofen and acetaminophen.   Flank Pain Flank pain refers to pain that is located on the side of the body between the upper abdomen and the back. The pain may occur over a short period of time (acute) or may be long-term or reoccurring (chronic). It may be mild or severe. Flank pain can be caused by many things. CAUSES  Some of the more common causes of flank pain include:  Muscle strains.   Muscle spasms.   A disease of your spine (vertebral disk disease).   A lung infection (pneumonia).   Fluid around your lungs (pulmonary edema).   A kidney infection.   Kidney stones.   A very painful skin rash caused by the chickenpox virus (shingles).   Gallbladder disease.  HOME CARE INSTRUCTIONS  Home care will depend on the cause of your pain. In general,  Rest as directed by your caregiver.  Drink enough fluids to keep your urine clear or pale yellow.  Only take over-the-counter or prescription medicines as directed by your caregiver. Some medicines may help relieve the pain.  Tell your caregiver about any changes in your pain.  Follow up with your caregiver as directed. SEEK IMMEDIATE MEDICAL CARE IF:   Your pain is not controlled with medicine.   You have new or worsening symptoms.  Your pain increases.   You have abdominal pain.   You have shortness of breath.   You have persistent nausea or vomiting.   You have swelling in your abdomen.   You feel faint or pass out.   You have blood in your urine.  You have a fever or persistent symptoms for more than 2-3 days.  You have a fever and your symptoms suddenly get worse. MAKE SURE YOU:   Understand these instructions.  Will watch your condition.  Will get help right away if you are not doing well or get worse. Document Released: 07/15/2005 Document Revised: 02/16/2012 Document Reviewed:  01/06/2012 [facility] Patient Information 2015 [place], [place]. This information is not intended to replace advice given to you by your health care provider. Make sure you discuss any questions you have with your health care provider.  Tramadol tablets What is this medicine? TRAMADOL (TRA ma dole) is a pain reliever. It is used to treat moderate to severe pain in adults. This medicine may be used for other purposes; ask your health care provider or pharmacist if you have questions. COMMON BRAND NAME(S): Ultram What should I tell my health care provider before I take this medicine? They need to know if you have any of these conditions: -brain tumor -depression -drug abuse or addiction -head injury -if you frequently drink alcohol containing drinks -kidney disease or trouble passing urine -liver disease -lung disease, asthma, or breathing problems -seizures or epilepsy -suicidal thoughts, plans, or attempt; a previous suicide attempt by you or a family member -an unusual or allergic reaction to tramadol, codeine, other medicines, foods, dyes, or preservatives -pregnant or trying to get pregnant -breast-feeding How should I use this medicine? Take this medicine by mouth with a full glass of water. Follow the directions on the prescription label. If the medicine upsets your stomach, take it with food or milk. Do not take more medicine than you are told to take. Talk to your pediatrician regarding the use of this medicine in children. Special care may be needed. Overdosage: If you think you have taken too  much of this medicine contact a poison control center or emergency room at once. NOTE: This medicine is only for you. Do not share this medicine with others. What if I miss a dose? If you miss a dose, take it as soon as you can. If it is almost time for your next dose, take only that dose. Do not take double or extra doses. What may interact with this medicine? Do not take this medicine with  any of the following medications: -MAOIs like Carbex, Eldepryl, Marplan, Nardil, and Parnate This medicine may also interact with the following medications: -alcohol or medicines that contain alcohol -antihistamines -benzodiazepines -bupropion -carbamazepine or oxcarbazepine -clozapine -cyclobenzaprine -digoxin -furazolidone -linezolid -medicines for depression, anxiety, or psychotic disturbances -medicines for migraine headache like almotriptan, eletriptan, frovatriptan, naratriptan, rizatriptan, sumatriptan, zolmitriptan -medicines for pain like pentazocine, buprenorphine, butorphanol, meperidine, nalbuphine, and propoxyphene -medicines for sleep -muscle relaxants -naltrexone -phenobarbital -phenothiazines like perphenazine, thioridazine, chlorpromazine, mesoridazine, fluphenazine, prochlorperazine, promazine, and trifluoperazine -procarbazine -warfarin This list may not describe all possible interactions. Give your health care provider a list of all the medicines, herbs, non-prescription drugs, or dietary supplements you use. Also tell them if you smoke, drink alcohol, or use illegal drugs. Some items may interact with your medicine. What should I watch for while using this medicine? Tell your doctor or health care professional if your pain does not go away, if it gets worse, or if you have new or a different type of pain. You may develop tolerance to the medicine. Tolerance means that you will need a higher dose of the medicine for pain relief. Tolerance is normal and is expected if you take this medicine for a long time. Do not suddenly stop taking your medicine because you may develop a severe reaction. Your body becomes used to the medicine. This does NOT mean you are addicted. Addiction is a behavior related to getting and using a drug for a non-medical reason. If you have pain, you have a medical reason to take pain medicine. Your doctor will tell you how much medicine to take. If  your doctor wants you to stop the medicine, the dose will be slowly lowered over time to avoid any side effects. You may get drowsy or dizzy. Do not drive, use machinery, or do anything that needs mental alertness until you know how this medicine affects you. Do not stand or sit up quickly, especially if you are an older patient. This reduces the risk of dizzy or fainting spells. Alcohol can increase or decrease the effects of this medicine. Avoid alcoholic drinks. You may have constipation. Try to have a bowel movement at least every 2 to 3 days. If you do not have a bowel movement for 3 days, call your doctor or health care professional. Your mouth may get dry. Chewing sugarless gum or sucking hard candy, and drinking plenty of water may help. Contact your doctor if the problem does not go away or is severe. What side effects may I notice from receiving this medicine? Side effects that you should report to your doctor or health care professional as soon as possible: -allergic reactions like skin rash, itching or hives, swelling of the face, lips, or tongue -breathing difficulties, wheezing -confusion -itching -light headedness or fainting spells -redness, blistering, peeling or loosening of the skin, including inside the mouth -seizures Side effects that usually do not require medical attention (report to your doctor or health care professional if they continue or are bothersome): -constipation -dizziness -drowsiness -headache -nausea,  vomiting This list may not describe all possible side effects. Call your doctor for medical advice about side effects. You may report side effects to FDA at 1-800-FDA-1088. Where should I keep my medicine? Keep out of the reach of children. Store at room temperature between 15 and 30 degrees C (59 and 86 degrees F). Keep container tightly closed. Throw away any unused medicine after the expiration date. NOTE: This sheet is a summary. It may not cover all  possible information. If you have questions about this medicine, talk to your doctor, pharmacist, or health care provider.  2015, [employer]. (2010-02-04 11:55:44)

## 2014-10-01 NOTE — ED Provider Notes (Signed)
CSN: [id number]     Arrival date & time 09/30/14  2344 History   First MD Initiated Contact with Patient 10/01/14 0208     Chief Complaint  Patient presents with  . Flank Pain     (Consider location/radiation/quality/duration/timing/severity/associated sxs/prior Treatment) Patient is a 39 y.o. male presenting with flank pain. The history is provided by the patient.  Flank Pain  He states he has a history of kidney stones. Yesterday, he developed left flank pain which resolved after he passed a small stone. Today, he had recurrence of similar pain. He describes a sharp pain in the left flank without radiation. He rates pain at 8/10 although it does wax and wane. There is mild associated nausea but no vomiting. He denies fever or chills. He denies abdominal pain. There is slight dysuria but no urgency or tenesmus. He has taken ibuprofen and acetaminophen without relief. [facility] [place] [facility] [place] [facility] [place] [facility] and he was told that he had many stones in his kidneys.  Past Medical History  Diagnosis Date  . Asthma   . Kidney stone   . Kidney stones   . Hypertension    History reviewed. No pertinent past surgical history. Family History  Problem Relation Age of Onset  . Nephrolithiasis Father    History  Substance Use Topics  . Smoking status: Current Every Day Smoker -- 1.00 packs/day for 10 years    Types: Cigarettes  . Smokeless tobacco: Never Used  . Alcohol Use: Yes     Comment: "occassionally"    Review of Systems  Genitourinary: Positive for flank pain.  All other systems reviewed and are negative.     Allergies  Review of patient's allergies indicates no known allergies.  Home Medications   Prior to Admission medications   Medication Sig Start Date End Date Taking? Authorizing Provider  acetaminophen (TYLENOL) 500 MG tablet Take 1,000 mg by mouth daily as needed. For pain     Historical Provider, MD  Aspirin-Salicylamide-Caffeine (BC  HEADACHE POWDER PO) Take 1 packet by mouth daily as needed (dental pain).    Historical Provider, MD  celecoxib (CELEBREX) 100 MG capsule Take 1 capsule (100 mg total) by mouth 2 (two) times daily. 08/24/14   Ivery QualeHobson Bryant, PA-C  clindamycin (CLEOCIN) 150 MG capsule 2 po tid with food 08/24/14   Ivery QualeHobson Bryant, PA-C  ibuprofen (ADVIL,MOTRIN) 200 MG tablet Take 400-600 mg by mouth every 6 (six) hours as needed for moderate pain.    Historical Provider, MD  ibuprofen (ADVIL,MOTRIN) 600 MG tablet Take 1 tablet (600 mg total) by mouth every 6 (six) hours as needed. Patient not taking: Reported on 08/24/2014 03/04/14   Burgess AmorJulie Idol, PA-C  lisinopril-hydrochlorothiazide (ZESTORETIC) 10-12.5 MG per tablet Take 1 tablet by mouth daily. 10/17/13   Burgess AmorJulie Idol, PA-C  traMADol (ULTRAM) 50 MG tablet Take 1 tablet (50 mg total) by mouth every 6 (six) hours as needed. Patient not taking: Reported on 08/24/2014 05/23/14   Burgess AmorJulie Idol, PA-C   BP 131/103 mmHg  Pulse 109  Temp(Src) 98.8 F (37.1 C)  Resp 18  Ht 5\' 8"  (1.727 m)  Wt 150 lb (68.04 kg)  BMI 22.81 kg/m2  SpO2 99% Physical Exam  Nursing note and vitals reviewed.  39 year old male, resting comfortably and in no acute distress. Vital signs are significant for tachycardia and hypertension. Oxygen saturation is 99%, which is normal. Head is normocephalic and atraumatic. PERRLA, EOMI. Oropharynx is clear. Neck is nontender  and supple without adenopathy or JVD. Back is nontender in the midline. There is mild left CVA tenderness. Lungs are clear without rales, wheezes, or rhonchi. Chest is nontender. Heart has regular rate and rhythm without murmur. Abdomen is soft, flat, nontender without masses or hepatosplenomegaly and peristalsis is normoactive. Extremities have no cyanosis or edema, full range of motion is present. Skin is warm and dry without rash. Neurologic: Mental status is normal, cranial nerves are intact, there are no motor or sensory  deficits.  ED Course  Procedures (including critical care time) Labs Review Results for orders placed or performed during the hospital encounter of 10/01/14  Urinalysis, Routine w reflex microscopic  Result Value Ref Range   Color, Urine YELLOW YELLOW   APPearance CLEAR CLEAR   Specific Gravity, Urine 1.010 1.005 - 1.030   pH 5.5 5.0 - 8.0   Glucose, UA NEGATIVE NEGATIVE mg/dL   Hgb urine dipstick NEGATIVE NEGATIVE   Bilirubin Urine NEGATIVE NEGATIVE   Ketones, ur NEGATIVE NEGATIVE mg/dL   Protein, ur NEGATIVE NEGATIVE mg/dL   Urobilinogen, UA 0.2 0.0 - 1.0 mg/dL   Nitrite NEGATIVE NEGATIVE   Leukocytes, UA NEGATIVE NEGATIVE   Imaging Review Ct Renal Stone Study  10/01/2014   CLINICAL DATA:  LEFT flank pain for 2 days, passed stone yesterday. History of kidney stones and hypertension.  EXAM: CT ABDOMEN AND PELVIS WITHOUT CONTRAST  TECHNIQUE: Multidetector CT imaging of the abdomen and pelvis was performed following the standard protocol without IV contrast.  COMPARISON:  CT of the abdomen and pelvis November 27, 2003  FINDINGS: LUNG BASES: Included view of the lung bases are clear. The visualized heart and pericardium are unremarkable.  KIDNEYS/BLADDER: Kidneys are orthotopic, demonstrating normal size and morphology. 5 mm LEFT upper pole, 5 mm LEFT lower pole, faint 4 mm LEFT lower pole, 2 mm RIGHT upper pole nephrolithiasis. Multiple RIGHT nephrolithiasis measuring up to 2 mm in lower pole. No hydronephrosis; limited assessment for renal masses on this nonenhanced examination. The unopacified ureters are normal in course and caliber. Urinary bladder is partially distended and unremarkable.  SOLID ORGANS: The liver, spleen, gallbladder, pancreas and adrenal glands are unremarkable for this non-contrast examination.  GASTROINTESTINAL TRACT: Small hiatal hernia. The stomach, small and large bowel are normal in course and caliber without inflammatory changes, the sensitivity may be decreased by lack  of enteric contrast. The appendix is not discretely identified, however there are no inflammatory changes in the right lower quadrant. Very mild colonic diverticulosis.  PERITONEUM/RETROPERITONEUM: Aortoiliac vessels are normal in course and caliber, trace calcific atherosclerosis. No lymphadenopathy by CT size criteria. Prostate appears normal in size. No intraperitoneal free fluid nor free air. Calcified tiny granuloma and anterior pelvis.  SOFT TISSUES/ OSSEOUS STRUCTURES:  Nonsuspicious.  IMPRESSION: Bilateral nonobstructing nephrolithiasis measuring up to 5 mm on the LEFT.   Electronically Signed   By: Awilda Metro   On: 10/01/2014 03:37     MDM   Final diagnoses:  Left flank pain  Nephrolithiasis    Left flank pain but urinalysis shows no evidence of hematuria. There are no recent CT scans in our records. He'll be sent for has CT scan to evaluate for possible urolithiasis.  CT scan shows nephrolithiasis without any evidence of ureterolithiasis. Patient is advised of this finding. He is discharged with prescription for tramadol for pain but advised to use over-the-counter analgesics as his primary means of pain control.  Dione Booze, MD 10/01/14 [phone]

## 2014-10-01 NOTE — ED Notes (Addendum)
Patient states he passed a kidney stone recently, and feels like he may have another one. Pain to left flank started today.

## 2014-10-01 NOTE — ED Notes (Signed)
Patient verbalizes understanding of discharge instructions, prescription medications, home care and follow up care. Patient ambulatory out of department at this time. 

## 2014-11-22 ENCOUNTER — Encounter (HOSPITAL_COMMUNITY): Payer: Self-pay | Admitting: *Deleted

## 2014-11-22 ENCOUNTER — Emergency Department (HOSPITAL_COMMUNITY)
Admission: EM | Admit: 2014-11-22 | Discharge: 2014-11-22 | Disposition: A | Discharge status: Home / Self Care | Payer: Medicaid Other | Attending: Emergency Medicine | Admitting: Emergency Medicine

## 2014-11-22 DIAGNOSIS — K047 Periapical abscess without sinus: Secondary | ICD-10-CM | POA: Insufficient documentation

## 2014-11-22 DIAGNOSIS — Z792 Long term (current) use of antibiotics: Secondary | ICD-10-CM | POA: Diagnosis not present

## 2014-11-22 DIAGNOSIS — J45909 Unspecified asthma, uncomplicated: Secondary | ICD-10-CM | POA: Diagnosis not present

## 2014-11-22 DIAGNOSIS — Z87442 Personal history of urinary calculi: Secondary | ICD-10-CM | POA: Diagnosis not present

## 2014-11-22 DIAGNOSIS — I10 Essential (primary) hypertension: Secondary | ICD-10-CM | POA: Diagnosis not present

## 2014-11-22 DIAGNOSIS — Z720 Tobacco use: Secondary | ICD-10-CM | POA: Insufficient documentation

## 2014-11-22 DIAGNOSIS — K029 Dental caries, unspecified: Secondary | ICD-10-CM

## 2014-11-22 DIAGNOSIS — Z7982 Long term (current) use of aspirin: Secondary | ICD-10-CM | POA: Insufficient documentation

## 2014-11-22 DIAGNOSIS — K088 Other specified disorders of teeth and supporting structures: Secondary | ICD-10-CM | POA: Diagnosis present

## 2014-11-22 MED ORDER — TRAMADOL HCL 50 MG PO TABS: 50.0000 mg | ORAL_TABLET | Freq: Four times a day (QID) | ORAL | Status: DC | PRN

## 2014-11-22 MED ORDER — AMOXICILLIN 500 MG PO CAPS: 500.0000 mg | ORAL_CAPSULE | Freq: Three times a day (TID) | ORAL | Status: DC

## 2014-11-22 NOTE — Discharge Instructions (Signed)

## 2014-11-22 NOTE — ED Notes (Addendum)
Pt c/o pain to a tooth in the lower front of his mouth x 2 days. Pt did not take his blood pressure pill today.

## 2014-11-22 NOTE — ED Notes (Signed)
Dental pain with gum swelling, mandibular central and lat incisor.  Mult dental caries.

## 2014-11-24 NOTE — ED Provider Notes (Signed)
CSN: [id number]     Arrival date & time 11/22/14  1933 History   First MD Initiated Contact with Patient 11/22/14 2014     Chief Complaint  Patient presents with  . Dental Pain     (Consider location/radiation/quality/duration/timing/severity/associated sxs/prior Treatment) The history is provided by the patient.   Alan Park is a 39 y.o. male presenting with a 2 day history of worsening pain and swelling of the gumline along his lower anterior teeth.  He has had most of his teeth extracted, still with remaining lower central and lateral incisors which are decayed and also need extraction. He denies fevers, chills, difficulty swallowing.  He has taken tylenol and bc powders with no significant improvement in pain.     Past Medical History  Diagnosis Date  . Asthma   . Kidney stone   . Kidney stones   . Hypertension    History reviewed. No pertinent past surgical history. Family History  Problem Relation Age of Onset  . Nephrolithiasis Father    History  Substance Use Topics  . Smoking status: Current Every Day Smoker -- 1.00 packs/day for 10 years    Types: Cigarettes  . Smokeless tobacco: Never Used  . Alcohol Use: Yes     Comment: "occassionally"    Review of Systems  Constitutional: Negative for fever.  HENT: Positive for dental problem. Negative for facial swelling and sore throat.   Respiratory: Negative for shortness of breath.   Musculoskeletal: Negative for neck pain and neck stiffness.      Allergies  Review of patient's allergies indicates no known allergies.  Home Medications   Prior to Admission medications   Medication Sig Start Date End Date Taking? Authorizing Provider  acetaminophen (TYLENOL) 500 MG tablet Take 1,000 mg by mouth daily as needed. For pain     Historical Provider, MD  amoxicillin (AMOXIL) 500 MG capsule Take 1 capsule (500 mg total) by mouth 3 (three) times daily. 11/22/14   Burgess Amor, PA-C  Aspirin-Salicylamide-Caffeine  (BC HEADACHE POWDER PO) Take 1 packet by mouth daily as needed (dental pain).    Historical Provider, MD  celecoxib (CELEBREX) 100 MG capsule Take 1 capsule (100 mg total) by mouth 2 (two) times daily. 08/24/14   Ivery Quale, PA-C  clindamycin (CLEOCIN) 150 MG capsule 2 po tid with food 08/24/14   Ivery Quale, PA-C  ibuprofen (ADVIL,MOTRIN) 200 MG tablet Take 400-600 mg by mouth every 6 (six) hours as needed for moderate pain.    Historical Provider, MD  ibuprofen (ADVIL,MOTRIN) 600 MG tablet Take 1 tablet (600 mg total) by mouth every 6 (six) hours as needed. Patient not taking: Reported on 08/24/2014 03/04/14   Burgess Amor, PA-C  lisinopril-hydrochlorothiazide (ZESTORETIC) 10-12.5 MG per tablet Take 1 tablet by mouth daily. 10/17/13   Burgess Amor, PA-C  traMADol (ULTRAM) 50 MG tablet Take 1 tablet (50 mg total) by mouth every 6 (six) hours as needed. 11/22/14   Burgess Amor, PA-C   BP 157/103 mmHg  Pulse 94  Temp(Src) 98.7 F (37.1 C) (Oral)  Resp 18  Ht 5\' 8"  (1.727 m)  Wt 150 lb (68.04 kg)  BMI 22.81 kg/m2  SpO2 100% Physical Exam  Constitutional: He is oriented to person, place, and time. He appears well-developed and well-nourished. No distress.  HENT:  Head: Normocephalic and atraumatic.  Right Ear: Tympanic membrane and external ear normal.  Left Ear: Tympanic membrane and external ear normal.  Mouth/Throat: Uvula is midline, oropharynx is clear and  moist and mucous membranes are normal. No oral lesions. No trismus in the jaw. Dental caries present. No dental abscesses or uvula swelling.    Partially edentulous. Gingival edema with advanced decay of lower incisors.  No fluctuant abscess appreciated. Sublingual space is soft.  Eyes: Conjunctivae are normal.  Neck: Normal range of motion. Neck supple.  Cardiovascular: Normal rate and normal heart sounds.   Pulmonary/Chest: Effort normal.  Lymphadenopathy:    He has no cervical adenopathy.  Neurological: He is alert and oriented to  person, place, and time.  Skin: Skin is warm and dry. No erythema.  Psychiatric: He has a normal mood and affect.    ED Course  Procedures (including critical care time) Labs Review Labs Reviewed - No data to display  Imaging Review No results found.   EKG Interpretation None      MDM   Final diagnoses:  Dental abscess  Dental decay    Pt prescribed amoxil, tramadol. Advised f/u with dentistry, referrals given, advised to take his bp med which he skipped today.  The patient appears reasonably screened and/or stabilized for discharge and I doubt any other medical condition or other [facility] requiring further screening, evaluation, or treatment in the ED at this time prior to discharge.     Burgess Amor, PA-C 11/24/14 1315  Benjiman Core, MD 11/25/14 [phone]

## 2015-03-01 ENCOUNTER — Encounter (HOSPITAL_COMMUNITY): Payer: Self-pay | Admitting: *Deleted

## 2015-03-01 ENCOUNTER — Emergency Department (HOSPITAL_COMMUNITY)
Admission: EM | Admit: 2015-03-01 | Discharge: 2015-03-01 | Disposition: A | Discharge status: Home / Self Care | Payer: Medicaid Other | Attending: Emergency Medicine | Admitting: Emergency Medicine

## 2015-03-01 DIAGNOSIS — J45909 Unspecified asthma, uncomplicated: Secondary | ICD-10-CM | POA: Insufficient documentation

## 2015-03-01 DIAGNOSIS — K029 Dental caries, unspecified: Secondary | ICD-10-CM

## 2015-03-01 DIAGNOSIS — Z79899 Other long term (current) drug therapy: Secondary | ICD-10-CM | POA: Insufficient documentation

## 2015-03-01 DIAGNOSIS — I10 Essential (primary) hypertension: Secondary | ICD-10-CM | POA: Diagnosis not present

## 2015-03-01 DIAGNOSIS — Z720 Tobacco use: Secondary | ICD-10-CM | POA: Insufficient documentation

## 2015-03-01 DIAGNOSIS — K0381 Cracked tooth: Secondary | ICD-10-CM | POA: Diagnosis not present

## 2015-03-01 DIAGNOSIS — Z87442 Personal history of urinary calculi: Secondary | ICD-10-CM | POA: Insufficient documentation

## 2015-03-01 DIAGNOSIS — K088 Other specified disorders of teeth and supporting structures: Secondary | ICD-10-CM | POA: Insufficient documentation

## 2015-03-01 DIAGNOSIS — Z791 Long term (current) use of non-steroidal anti-inflammatories (NSAID): Secondary | ICD-10-CM | POA: Diagnosis not present

## 2015-03-01 MED ORDER — AMOXICILLIN 250 MG PO CAPS
500.0000 mg | ORAL_CAPSULE | Freq: Once | ORAL | Status: AC
  Administered 2015-03-01: 500 mg via ORAL
  Filled 2015-03-01: qty 2

## 2015-03-01 MED ORDER — OXYCODONE-ACETAMINOPHEN 5-325 MG PO TABS
2.0000 | ORAL_TABLET | Freq: Once | ORAL | Status: AC
  Administered 2015-03-01: 2 via ORAL
  Filled 2015-03-01: qty 2

## 2015-03-01 MED ORDER — OXYCODONE-ACETAMINOPHEN 5-325 MG PO TABS: 1.0000 | ORAL_TABLET | Freq: Four times a day (QID) | ORAL | Status: DC | PRN

## 2015-03-01 MED ORDER — AMOXICILLIN 500 MG PO CAPS: 500.0000 mg | ORAL_CAPSULE | Freq: Three times a day (TID) | ORAL | Status: DC

## 2015-03-01 NOTE — ED Notes (Signed)
Dental pain after a tooth broke off this morning while eating.

## 2015-03-01 NOTE — ED Provider Notes (Signed)
CSN: [id number]     Arrival date & time 03/01/15  1455 History   First MD Initiated Contact with Patient 03/01/15 1517     Chief Complaint  Patient presents with  . Dental Pain     (Consider location/radiation/quality/duration/timing/severity/associated sxs/prior Treatment) HPI..... Patient presents with broken tooth since this morning of a left lateral lower incisor.  He has extremely poor dentition. He c/o pain in the same area. No fever or chills. No access to a dentist.  Past Medical History  Diagnosis Date  . Asthma   . Kidney stone   . Kidney stones   . Hypertension    History reviewed. No pertinent past surgical history. Family History  Problem Relation Age of Onset  . Nephrolithiasis Father    Social History  Substance Use Topics  . Smoking status: Current Every Day Smoker -- 1.00 packs/day for 10 years    Types: Cigarettes  . Smokeless tobacco: Never Used  . Alcohol Use: Yes     Comment: "occassionally"    Review of Systems  All other systems reviewed and are negative.     Allergies  Review of patient's allergies indicates no known allergies.  Home Medications   Prior to Admission medications   Medication Sig Start Date End Date Taking? Authorizing Provider  ibuprofen (ADVIL,MOTRIN) 800 MG tablet Take 800 mg by mouth every 8 (eight) hours as needed for mild pain.   Yes Historical Provider, MD  lisinopril-hydrochlorothiazide (ZESTORETIC) 10-12.5 MG per tablet Take 1 tablet by mouth daily. 10/17/13  Yes Raynelle Fanning Idol, PA-C  amoxicillin (AMOXIL) 500 MG capsule Take 1 capsule (500 mg total) by mouth 3 (three) times daily. 03/01/15   Donnetta Hutching, MD  celecoxib (CELEBREX) 100 MG capsule Take 1 capsule (100 mg total) by mouth 2 (two) times daily. 08/24/14   Ivery Quale, PA-C  clindamycin (CLEOCIN) 150 MG capsule 2 po tid with food 08/24/14   Ivery Quale, PA-C  ibuprofen (ADVIL,MOTRIN) 600 MG tablet Take 1 tablet (600 mg total) by mouth every 6 (six) hours as  needed. Patient not taking: Reported on 08/24/2014 03/04/14   Burgess Amor, PA-C  oxyCODONE-acetaminophen (PERCOCET/ROXICET) 5-325 MG per tablet Take 1-2 tablets by mouth every 6 (six) hours as needed. 03/01/15   Donnetta Hutching, MD  traMADol (ULTRAM) 50 MG tablet Take 1 tablet (50 mg total) by mouth every 6 (six) hours as needed. 11/22/14   Burgess Amor, PA-C   BP 149/92 mmHg  Pulse 107  Temp(Src) 98.6 F (37 C) (Oral)  Resp 16  SpO2 100% Physical Exam  Constitutional: He is oriented to person, place, and time. He appears well-developed and well-nourished.  HENT:  Head: Normocephalic and atraumatic.  Very poor dentition with many missing teeth. Partially fractured left lower lateral incisor.  Eyes: Conjunctivae and EOM are normal. Pupils are equal, round, and reactive to light.  Neck: Normal range of motion. Neck supple.  Musculoskeletal: Normal range of motion.  Neurological: He is alert and oriented to person, place, and time.  Skin: Skin is warm and dry.  Psychiatric: He has a normal mood and affect. His behavior is normal.  Nursing note and vitals reviewed.   ED Course  Procedures (including critical care time) Labs Review Labs Reviewed - No data to display  Imaging Review No results found. I have personally reviewed and evaluated these images and lab results as part of my medical decision-making.   EKG Interpretation None      MDM   Final diagnoses:  Tooth  caries    Rx amoxicillin, Percocet. Referral to dentist.    Donnetta Hutching, MD 03/01/15 2216

## 2015-03-01 NOTE — Discharge Instructions (Signed)
Can take ibuprofen.   Meds for pain and antibiotics.   Need to see a dentist.

## 2015-04-06 ENCOUNTER — Encounter (HOSPITAL_COMMUNITY): Payer: Self-pay | Admitting: Emergency Medicine

## 2015-04-06 ENCOUNTER — Emergency Department (HOSPITAL_COMMUNITY): Payer: Medicaid Other

## 2015-04-06 ENCOUNTER — Emergency Department (HOSPITAL_COMMUNITY)
Admission: EM | Admit: 2015-04-06 | Discharge: 2015-04-06 | Disposition: A | Discharge status: Home / Self Care | Payer: Medicaid Other | Attending: Emergency Medicine | Admitting: Emergency Medicine

## 2015-04-06 DIAGNOSIS — Y990 Civilian activity done for income or pay: Secondary | ICD-10-CM | POA: Diagnosis not present

## 2015-04-06 DIAGNOSIS — Z87442 Personal history of urinary calculi: Secondary | ICD-10-CM | POA: Diagnosis not present

## 2015-04-06 DIAGNOSIS — Y93H2 Activity, gardening and landscaping: Secondary | ICD-10-CM | POA: Diagnosis not present

## 2015-04-06 DIAGNOSIS — Z791 Long term (current) use of non-steroidal anti-inflammatories (NSAID): Secondary | ICD-10-CM | POA: Insufficient documentation

## 2015-04-06 DIAGNOSIS — Z792 Long term (current) use of antibiotics: Secondary | ICD-10-CM | POA: Diagnosis not present

## 2015-04-06 DIAGNOSIS — J45909 Unspecified asthma, uncomplicated: Secondary | ICD-10-CM | POA: Diagnosis not present

## 2015-04-06 DIAGNOSIS — S4992XA Unspecified injury of left shoulder and upper arm, initial encounter: Secondary | ICD-10-CM | POA: Diagnosis present

## 2015-04-06 DIAGNOSIS — Z720 Tobacco use: Secondary | ICD-10-CM | POA: Insufficient documentation

## 2015-04-06 DIAGNOSIS — I10 Essential (primary) hypertension: Secondary | ICD-10-CM

## 2015-04-06 DIAGNOSIS — S46912A Strain of unspecified muscle, fascia and tendon at shoulder and upper arm level, left arm, initial encounter: Secondary | ICD-10-CM

## 2015-04-06 DIAGNOSIS — S46912D Strain of unspecified muscle, fascia and tendon at shoulder and upper arm level, left arm, subsequent encounter: Secondary | ICD-10-CM | POA: Diagnosis not present

## 2015-04-06 DIAGNOSIS — R000 Tachycardia, unspecified: Secondary | ICD-10-CM | POA: Diagnosis not present

## 2015-04-06 DIAGNOSIS — X58XXXA Exposure to other specified factors, initial encounter: Secondary | ICD-10-CM | POA: Insufficient documentation

## 2015-04-06 DIAGNOSIS — Y9289 Other specified places as the place of occurrence of the external cause: Secondary | ICD-10-CM | POA: Diagnosis not present

## 2015-04-06 MED ORDER — ACETAMINOPHEN 500 MG PO TABS
1000.0000 mg | ORAL_TABLET | Freq: Once | ORAL | Status: AC
  Administered 2015-04-06: 1000 mg via ORAL
  Filled 2015-04-06: qty 2

## 2015-04-06 MED ORDER — NAPROXEN 375 MG PO TABS: 375.0000 mg | ORAL_TABLET | Freq: Two times a day (BID) | ORAL | Status: DC

## 2015-04-06 MED ORDER — KETOROLAC TROMETHAMINE 60 MG/2ML IM SOLN
60.0000 mg | Freq: Once | INTRAMUSCULAR | Status: AC
  Administered 2015-04-06: 60 mg via INTRAMUSCULAR
  Filled 2015-04-06: qty 2

## 2015-04-06 MED ORDER — LISINOPRIL-HYDROCHLOROTHIAZIDE 10-12.5 MG PO TABS: 1.0000 | ORAL_TABLET | Freq: Every day | ORAL | Status: DC

## 2015-04-06 NOTE — ED Provider Notes (Signed)
CSN: [id number]     Arrival date & time 04/06/15  1320 History   First MD Initiated Contact with Patient 04/06/15 1506     Chief Complaint  Patient presents with  . Shoulder Pain     (Consider location/radiation/quality/duration/timing/severity/associated sxs/prior Treatment) HPI Comments: 39 year old male with history of high blood pressure, smoking, shoulder injury in the left without surgery presents with worsening left shoulder pain for the past year. Worse the past 2 days specifically. Patient has been doing landscape work recently. No weakness or numbness pain with range of motion. Patient has not had an MRI for this no primary doctor. Patient is been out of his blood pressure medications.  Patient is a 39 y.o. male presenting with shoulder pain. The history is provided by the patient.  Shoulder Pain Associated symptoms: no back pain, no fever and no neck pain     Past Medical History  Diagnosis Date  . Asthma   . Kidney stone   . Kidney stones   . Hypertension    History reviewed. No pertinent past surgical history. Family History  Problem Relation Age of Onset  . Nephrolithiasis Father    Social History  Substance Use Topics  . Smoking status: Current Every Day Smoker -- 1.00 packs/day for 10 years    Types: Cigarettes  . Smokeless tobacco: Never Used  . Alcohol Use: Yes     Comment: "occassionally"    Review of Systems  Constitutional: Negative for fever and chills.  HENT: Negative for congestion.   Eyes: Negative for visual disturbance.  Respiratory: Negative for shortness of breath.   Cardiovascular: Negative for chest pain.  Gastrointestinal: Negative for vomiting and abdominal pain.  Genitourinary: Negative for dysuria and flank pain.  Musculoskeletal: Positive for arthralgias. Negative for back pain, joint swelling, neck pain and neck stiffness.  Skin: Negative for rash.  Neurological: Negative for light-headedness and headaches.      Allergies   Review of patient's allergies indicates no known allergies.  Home Medications   Prior to Admission medications   Medication Sig Start Date End Date Taking? Authorizing Provider  amoxicillin (AMOXIL) 500 MG capsule Take 1 capsule (500 mg total) by mouth 3 (three) times daily. 03/01/15   Donnetta Hutching, MD  celecoxib (CELEBREX) 100 MG capsule Take 1 capsule (100 mg total) by mouth 2 (two) times daily. 08/24/14   Ivery Quale, PA-C  clindamycin (CLEOCIN) 150 MG capsule 2 po tid with food 08/24/14   Ivery Quale, PA-C  ibuprofen (ADVIL,MOTRIN) 600 MG tablet Take 1 tablet (600 mg total) by mouth every 6 (six) hours as needed. Patient not taking: Reported on 08/24/2014 03/04/14   Burgess Amor, PA-C  ibuprofen (ADVIL,MOTRIN) 800 MG tablet Take 800 mg by mouth every 8 (eight) hours as needed for mild pain.    Historical Provider, MD  lisinopril-hydrochlorothiazide (ZESTORETIC) 10-12.5 MG tablet Take 1 tablet by mouth daily. 04/06/15   Blane Ohara, MD  naproxen (NAPROSYN) 375 MG tablet Take 1 tablet (375 mg total) by mouth 2 (two) times daily. 04/06/15   Blane Ohara, MD  oxyCODONE-acetaminophen (PERCOCET/ROXICET) 5-325 MG per tablet Take 1-2 tablets by mouth every 6 (six) hours as needed. 03/01/15   Donnetta Hutching, MD  traMADol (ULTRAM) 50 MG tablet Take 1 tablet (50 mg total) by mouth every 6 (six) hours as needed. 11/22/14   Burgess Amor, PA-C   BP 144/109 mmHg  Pulse 128  Temp(Src) 98.2 F (36.8 C) (Oral)  Resp 18  Ht  (1.727 m)  Wt 150 lb (68.04 kg)  BMI 22.81 kg/m2  SpO2 100% Physical Exam  Constitutional: He is oriented to person, place, and time. He appears well-developed and well-nourished.  HENT:  Head: Normocephalic and atraumatic.  Eyes: Conjunctivae are normal. Right eye exhibits no discharge. Left eye exhibits no discharge.  Neck: Normal range of motion. Neck supple. No tracheal deviation present.  Cardiovascular: Regular rhythm.  Tachycardia present.   Pulmonary/Chest: Effort normal  and breath sounds normal.  Abdominal: Soft. He exhibits no distension. There is no tenderness. There is no guarding.  Musculoskeletal: He exhibits tenderness. He exhibits no edema.  Patient has moderate pain with abduction of left shoulder. Patient has full range of motion however discomfort during. No warmth or effusion. Neurovascularly intact. 5+ strength flexion-extension of shoulder elbow and wrist. Significant pain with empty can testing  Neurological: He is alert and oriented to person, place, and time.  Skin: Skin is warm. No rash noted.  Psychiatric: He has a normal mood and affect.  Nursing note and vitals reviewed.   ED Course  Procedures (including critical care time) Labs Review Labs Reviewed - No data to display  Imaging Review Dg Shoulder Left  04/06/2015  CLINICAL DATA:  Left shoulder pain for 2 days.  No known injury EXAM: LEFT SHOULDER - 2+ VIEW COMPARISON:  Left shoulder plain film dated 05/27/2014. FINDINGS: There is no evidence of fracture or dislocation. There is no evidence of arthropathy or other focal bone abnormality. Soft tissues are unremarkable. IMPRESSION: Negative. Electronically Signed   By: Bary RichardStan  Maynard M.D.   On: 04/06/2015 14:18   I have personally reviewed and evaluated these images and lab results as part of my medical decision-making.   EKG Interpretation None      MDM   Final diagnoses:  Left shoulder strain, initial encounter  Essential hypertension   Concern for rotator strain, discussed outpatient follow-up with orthopedics. Discussed importance of taking blood pressure medications will refill patient's medications. Patient does have tachycardia and elevated blood pressure clinically from pain and not taking his medications. Patient has no chest pain or shortness of breath. X-ray no fracture. Discussed patient may need MRI outpatient. Results and differential diagnosis were discussed with the patient/parent/guardian. Xrays were  independently reviewed by myself.  Close follow up outpatient was discussed, comfortable with the plan.   Medications  ketorolac (TORADOL) injection 60 mg (not administered)    Filed Vitals:   04/06/15 1334  BP: 144/109  Pulse: 128  Temp: 98.2 F (36.8 C)  TempSrc: Oral  Resp: 18  Height: 5\' 8"  (1.727 m)  Weight: 150 lb (68.04 kg)  SpO2: 100%    Final diagnoses:  Left shoulder strain, initial encounter  Essential hypertension        Blane OharaJoshua Jamariya Davidoff, MD 04/06/15 [phone]

## 2015-04-06 NOTE — ED Notes (Signed)
Pt states left shoulder pain that started two days ago. Painful ROM, pt states he woke up with this pain and denies injury. Pt states pain goes down arm, but denies any tingling.

## 2015-04-06 NOTE — Discharge Instructions (Signed)
Follow with bone doctor, please obtain a primary doctor for blood pressure.  If you were given medicines take as directed.  If you are on coumadin or contraceptives realize their levels and effectiveness is altered by many different medicines.  If you have any reaction (rash, tongues swelling, other) to the medicines stop taking and see a physician.    If your blood pressure was elevated in the ER make sure you follow up for management with a primary doctor or return for chest pain, shortness of breath or stroke symptoms.  Please follow up as directed and return to the ER or see a physician for new or worsening symptoms.  Thank you. Filed Vitals:   04/06/15 1334  BP: 144/109  Pulse: 128  Temp: 98.2 F (36.8 C)  TempSrc: Oral  Resp: 18  Height: 5\' 8"  (1.727 m)  Weight: 150 lb (68.04 kg)  SpO2: 100%

## 2015-05-02 ENCOUNTER — Encounter (HOSPITAL_COMMUNITY): Payer: Self-pay | Admitting: Emergency Medicine

## 2015-05-02 ENCOUNTER — Emergency Department (HOSPITAL_COMMUNITY)
Admission: EM | Admit: 2015-05-02 | Discharge: 2015-05-02 | Disposition: A | Discharge status: Home / Self Care | Payer: Medicaid Other | Attending: Emergency Medicine | Admitting: Emergency Medicine

## 2015-05-02 ENCOUNTER — Emergency Department (HOSPITAL_COMMUNITY): Payer: Medicaid Other

## 2015-05-02 DIAGNOSIS — Y92007 Garden or yard of unspecified non-institutional (private) residence as the place of occurrence of the external cause: Secondary | ICD-10-CM | POA: Insufficient documentation

## 2015-05-02 DIAGNOSIS — M25512 Pain in left shoulder: Secondary | ICD-10-CM

## 2015-05-02 DIAGNOSIS — Y9372 Activity, wrestling: Secondary | ICD-10-CM | POA: Insufficient documentation

## 2015-05-02 DIAGNOSIS — Z87442 Personal history of urinary calculi: Secondary | ICD-10-CM | POA: Insufficient documentation

## 2015-05-02 DIAGNOSIS — F17210 Nicotine dependence, cigarettes, uncomplicated: Secondary | ICD-10-CM | POA: Diagnosis not present

## 2015-05-02 DIAGNOSIS — G8929 Other chronic pain: Secondary | ICD-10-CM | POA: Diagnosis not present

## 2015-05-02 DIAGNOSIS — I10 Essential (primary) hypertension: Secondary | ICD-10-CM | POA: Insufficient documentation

## 2015-05-02 DIAGNOSIS — Z79899 Other long term (current) drug therapy: Secondary | ICD-10-CM | POA: Insufficient documentation

## 2015-05-02 DIAGNOSIS — Y998 Other external cause status: Secondary | ICD-10-CM | POA: Insufficient documentation

## 2015-05-02 DIAGNOSIS — J45909 Unspecified asthma, uncomplicated: Secondary | ICD-10-CM | POA: Diagnosis not present

## 2015-05-02 DIAGNOSIS — X503XXA Overexertion from repetitive movements, initial encounter: Secondary | ICD-10-CM | POA: Insufficient documentation

## 2015-05-02 DIAGNOSIS — S4992XA Unspecified injury of left shoulder and upper arm, initial encounter: Secondary | ICD-10-CM | POA: Insufficient documentation

## 2015-05-02 MED ORDER — NAPROXEN 500 MG PO TABS: 500.0000 mg | ORAL_TABLET | Freq: Two times a day (BID) | ORAL | Status: DC

## 2015-05-02 NOTE — ED Provider Notes (Signed)
CSN: [id number]     Arrival date & time 05/02/15  1606 History   None    Chief Complaint  Patient presents with  . Shoulder Pain     (Consider location/radiation/quality/duration/timing/severity/associated sxs/prior Treatment) The history is provided by the patient.   Alan Park is a 39 y.o. male, right-handed, presenting with left shoulder pain since overusing it yesterday.  He describes having intermittent pain in the shoulder since falling off of a ladder months ago.  He was pain-free until he had increased activity yesterday playing with his kids, describing throwing fall and a yard, etc.  He denies any acute new injury.  Pain is worsened with movement.  He reports popping and clicking in the joint has been chronic since his fall from the ladder.  He denies weakness or numbness in his arm or hand.  He has not followed up with an orthopedist yet, despite being seen here several weeks ago for similar complaints.  He has found no alleviating or for his symptoms.    Past Medical History  Diagnosis Date  . Asthma   . Kidney stone   . Kidney stones   . Hypertension    History reviewed. No pertinent past surgical history. Family History  Problem Relation Age of Onset  . Nephrolithiasis Father    Social History  Substance Use Topics  . Smoking status: Current Every Day Smoker -- 1.00 packs/day for 10 years    Types: Cigarettes  . Smokeless tobacco: Never Used  . Alcohol Use: Yes     Comment: Occasionally    Review of Systems  Constitutional: Negative for fever.  Musculoskeletal: Positive for arthralgias. Negative for myalgias and joint swelling.  Neurological: Negative for weakness and numbness.      Allergies  Review of patient's allergies indicates no known allergies.  Home Medications   Prior to Admission medications   Medication Sig Start Date End Date Taking? Authorizing Provider  amoxicillin (AMOXIL) 500 MG capsule Take 1 capsule (500 mg total) by mouth 3  (three) times daily. Patient not taking: Reported on 04/06/2015 03/01/15   Donnetta Hutching, MD  lisinopril-hydrochlorothiazide (PRINZIDE,ZESTORETIC) 10-12.5 MG tablet Take 1 tablet by mouth daily.    Historical Provider, MD  lisinopril-hydrochlorothiazide (ZESTORETIC) 10-12.5 MG tablet Take 1 tablet by mouth daily. 04/06/15   Blane Ohara, MD  naproxen (NAPROSYN) 500 MG tablet Take 1 tablet (500 mg total) by mouth 2 (two) times daily. 05/02/15   Burgess Amor, PA-C   BP 132/85 mmHg  Pulse 100  Temp(Src) 98.1 F (36.7 C) (Oral)  Resp 18  Ht  (1.727 m)  Wt 68.04 kg  BMI 22.81 kg/m2  SpO2 100% Physical Exam  Constitutional: He appears well-developed and well-nourished.  HENT:  Head: Atraumatic.  Neck: Normal range of motion.  Cardiovascular:  Pulses equal bilaterally  Musculoskeletal: He exhibits tenderness.       Left shoulder: He exhibits tenderness. He exhibits normal pulse and normal strength.  Pain with abduction and forward flexion of her left shoulder.  There is no edema, erythema or increased warmth.  No crepitus noted at this time.  Distal sensation is intact with equal grip strength.  Radial pulses are full and equal bilaterally.  He has full strength in flexion and extension at the wrist and elbow.  Neurological: He is alert. He has normal strength. He displays normal reflexes. No sensory deficit.  Skin: Skin is warm and dry.  Psychiatric: He has a normal mood and affect.  ED Course  Procedures (including critical care time) Labs Review Labs Reviewed - No data to display  Imaging Review Dg Shoulder Left  05/02/2015  CLINICAL DATA:  Left shoulder pain after recent injury. EXAM: LEFT SHOULDER - 2+ VIEW COMPARISON:  04/06/2015 left shoulder radiographs FINDINGS: There is no evidence of fracture or dislocation. There is no evidence of arthropathy or other focal bone abnormality. Soft tissues are unremarkable. IMPRESSION: Negative. Electronically Signed   By: Delbert PhenixJason A Poff M.D.    On: 05/02/2015 16:53   I have personally reviewed and evaluated these images and lab results as part of my medical decision-making.   EKG Interpretation None      MDM   Final diagnoses:  Shoulder joint pain, left    Patient with acute on chronic left shoulder pain, suspect soft tissue injury, possible rotator cuff injury which was discussed with patient.  He was offered a sling for resting the joint, discussed need to maintain range of motion however.  Heat therapy recommended.  Recommended follow-up with orthopedics.  He will call his PCP on Monday to obtain a referral for this follow-up care.    Burgess AmorJulie Sandrine Bloodsworth, PA-C 05/02/15 1726  Glynn OctaveStephen Rancour, MD 05/02/15 [phone]

## 2015-05-02 NOTE — Discharge Instructions (Signed)

## 2015-05-02 NOTE — ED Notes (Signed)
Pt states that he was wrestling with kids yesterday and felt left shoulder pop and now is having pain.  Active ROM present.

## 2015-07-04 ENCOUNTER — Encounter (HOSPITAL_COMMUNITY): Payer: Self-pay

## 2015-07-04 ENCOUNTER — Emergency Department (HOSPITAL_COMMUNITY)
Admission: EM | Admit: 2015-07-04 | Discharge: 2015-07-04 | Disposition: A | Discharge status: Home / Self Care | Payer: Medicaid Other | Attending: Emergency Medicine | Admitting: Emergency Medicine

## 2015-07-04 ENCOUNTER — Emergency Department (HOSPITAL_COMMUNITY): Payer: Medicaid Other

## 2015-07-04 DIAGNOSIS — Z79899 Other long term (current) drug therapy: Secondary | ICD-10-CM | POA: Diagnosis not present

## 2015-07-04 DIAGNOSIS — R10A0 Flank pain, unspecified side: Secondary | ICD-10-CM

## 2015-07-04 DIAGNOSIS — R109 Unspecified abdominal pain: Secondary | ICD-10-CM | POA: Diagnosis present

## 2015-07-04 DIAGNOSIS — J45909 Unspecified asthma, uncomplicated: Secondary | ICD-10-CM | POA: Insufficient documentation

## 2015-07-04 DIAGNOSIS — I10 Essential (primary) hypertension: Secondary | ICD-10-CM | POA: Insufficient documentation

## 2015-07-04 DIAGNOSIS — Z87442 Personal history of urinary calculi: Secondary | ICD-10-CM | POA: Insufficient documentation

## 2015-07-04 DIAGNOSIS — R300 Dysuria: Secondary | ICD-10-CM | POA: Diagnosis not present

## 2015-07-04 DIAGNOSIS — F17210 Nicotine dependence, cigarettes, uncomplicated: Secondary | ICD-10-CM | POA: Insufficient documentation

## 2015-07-04 LAB — URINALYSIS, ROUTINE W REFLEX MICROSCOPIC
Bilirubin Urine: NEGATIVE
GLUCOSE, UA: NEGATIVE mg/dL
Hgb urine dipstick: NEGATIVE
KETONES UR: NEGATIVE mg/dL
Leukocytes, UA: NEGATIVE
Nitrite: NEGATIVE
PH: 5.5 (ref 5.0–8.0)
PROTEIN: NEGATIVE mg/dL
Specific Gravity, Urine: 1.015 (ref 1.005–1.030)

## 2015-07-04 LAB — BASIC METABOLIC PANEL
Anion gap: 11 (ref 5–15)
BUN: 16 mg/dL (ref 6–20)
CHLORIDE: 107 mmol/L (ref 101–111)
CO2: 22 mmol/L (ref 22–32)
CREATININE: 1.06 mg/dL (ref 0.61–1.24)
Calcium: 9 mg/dL (ref 8.9–10.3)
GFR calc Af Amer: >60 mL/min (ref >60)
GFR calc non Af Amer: >60 mL/min (ref >60)
GLUCOSE: 124 mg/dL — AB (ref 65–99)
Potassium: 3.3 mmol/L — ABNORMAL LOW (ref 3.5–5.1)
Sodium: 140 mmol/L (ref 135–145)

## 2015-07-04 LAB — CBC WITH DIFFERENTIAL/PLATELET
Basophils Absolute: 0 10*3/uL (ref 0.0–0.1)
Basophils Relative: 0 %
EOS ABS: 0.5 10*3/uL (ref 0.0–0.7)
EOS PCT: 5 %
HCT: 38.8 % — ABNORMAL LOW (ref 39.0–52.0)
HEMOGLOBIN: 13.2 g/dL (ref 13.0–17.0)
LYMPHS ABS: 2.9 10*3/uL (ref 0.7–4.0)
LYMPHS PCT: 30 %
MCH: 30.2 pg (ref 26.0–34.0)
MCHC: 34 g/dL (ref 30.0–36.0)
MCV: 88.8 fL (ref 78.0–100.0)
Monocytes Absolute: 0.5 10*3/uL (ref 0.1–1.0)
Monocytes Relative: 6 %
NEUTROS PCT: 59 %
Neutro Abs: 5.7 10*3/uL (ref 1.7–7.7)
Platelets: 340 10*3/uL (ref 150–400)
RBC: 4.37 MIL/uL (ref 4.22–5.81)
RDW: 13.1 % (ref 11.5–15.5)
WBC: 9.7 10*3/uL (ref 4.0–10.5)

## 2015-07-04 NOTE — ED Provider Notes (Addendum)
CSN: [id number]     Arrival date & time 07/04/15  1402 History   First MD Initiated Contact with Patient 07/04/15 1410     Chief Complaint  Patient presents with  . Flank Pain     (Consider location/radiation/quality/duration/timing/severity/associated sxs/prior Treatment) Patient is a 40 y.o. male presenting with flank pain. The history is provided by the patient.  Flank Pain Pertinent negatives include no chest pain, no abdominal pain, no headaches and no shortness of breath.   patient presents with right flank pain. Began last night. As dull and constant. States it feels somewhat like previous kidney stones was not a severe. It is dull. States he has had some dysuria. He has a previous history of kidney stones. Slight nausea. No vomiting or diarrhea. No fevers or chills. No trauma. No constipation. No cough or shortness of breath.  Past Medical History  Diagnosis Date  . Asthma   . Kidney stone   . Kidney stones   . Hypertension    History reviewed. No pertinent past surgical history. Family History  Problem Relation Age of Onset  . Nephrolithiasis Father    Social History  Substance Use Topics  . Smoking status: Current Every Day Smoker -- 1.00 packs/day for 10 years    Types: Cigarettes  . Smokeless tobacco: Never Used  . Alcohol Use: Yes     Comment: Occasionally    Review of Systems  Constitutional: Negative for activity change and appetite change.  Eyes: Negative for pain.  Respiratory: Negative for chest tightness and shortness of breath.   Cardiovascular: Negative for chest pain and leg swelling.  Gastrointestinal: Negative for nausea, vomiting, abdominal pain and diarrhea.  Genitourinary: Positive for dysuria and flank pain.  Musculoskeletal: Negative for back pain and neck stiffness.  Skin: Negative for rash.  Neurological: Negative for weakness, numbness and headaches.  Psychiatric/Behavioral: Negative for behavioral problems.      Allergies  Review of  patient's allergies indicates no known allergies.  Home Medications   Prior to Admission medications   Medication Sig Start Date End Date Taking? Authorizing Provider  albuterol (PROVENTIL HFA;VENTOLIN HFA) 108 (90 Base) MCG/ACT inhaler Inhale 2 puffs into the lungs every 6 (six) hours as needed for wheezing or shortness of breath.   Yes Historical Provider, MD  Aspirin-Salicylamide-Caffeine (BC HEADACHE POWDER PO) Take 2 packets by mouth.   Yes Historical Provider, MD  lisinopril-hydrochlorothiazide (PRINZIDE,ZESTORETIC) 10-12.5 MG tablet Take 1 tablet by mouth daily.   Yes Historical Provider, MD  lisinopril-hydrochlorothiazide (ZESTORETIC) 10-12.5 MG tablet Take 1 tablet by mouth daily. 04/06/15  Yes Blane Ohara, MD  naproxen (NAPROSYN) 500 MG tablet Take 1 tablet (500 mg total) by mouth 2 (two) times daily. Patient not taking: Reported on 07/04/2015 05/02/15   Burgess Amor, PA-C   BP 159/110 mmHg  Pulse 83  Temp(Src) 97.7 F (36.5 C) (Oral)  Resp 14  Ht  (1.676 m)  Wt 150 lb (68.04 kg)  BMI 24.22 kg/m2  SpO2 100% Physical Exam  Constitutional: He appears well-developed.  Patient does not appear uncomfortable  HENT:  Head: Atraumatic.  Neck: Neck supple.  Cardiovascular: Normal rate.   Pulmonary/Chest: Effort normal.  Abdominal: There is no tenderness.  Genitourinary:  Mild tenderness in right CVA area.  Neurological: He is alert.  Skin: Skin is warm.    ED Course  Procedures (including critical care time) Labs Review Labs Reviewed  CBC WITH DIFFERENTIAL/PLATELET - Abnormal; Notable for the following:    HCT 38.8 (*)  All other components within normal limits  URINALYSIS, ROUTINE W REFLEX MICROSCOPIC (NOT AT [facility])  BASIC METABOLIC PANEL    Imaging Review No results found. I have personally reviewed and evaluated these images and lab results as part of my medical decision-making.   EKG Interpretation None      MDM   Final diagnoses:  Flank pain     Patient with right flank pain. Lab work reassuring but urinalysis pending. Will get renal ultrasound.    Benjiman Core, MD 07/04/15 1520  Urinalysis does not show infection. Renal ultrasound does not show hydronephrosis. Overall benign exam may be muscle skeletal cause. Will discharge home.  Benjiman Core, MD 07/04/15 [phone]

## 2015-07-04 NOTE — Discharge Instructions (Signed)
Flank Pain °Flank pain refers to pain that is located on the side of the body between the upper abdomen and the back. The pain may occur over a short period of time (acute) or may be long-term or reoccurring (chronic). It may be mild or severe. Flank pain can be caused by many things. °CAUSES  °Some of the more common causes of flank pain include: °· Muscle strains.   °· Muscle spasms.   °· A disease of your spine (vertebral disk disease).   °· A lung infection (pneumonia).   °· Fluid around your lungs (pulmonary edema).   °· A kidney infection.   °· Kidney stones.   °· A very painful skin rash caused by the chickenpox virus (shingles).   °· Gallbladder disease.   °HOME CARE INSTRUCTIONS  °Home care will depend on the cause of your pain. In general, °· Rest as directed by your caregiver. °· Drink enough fluids to keep your urine clear or pale yellow. °· Only take over-the-counter or prescription medicines as directed by your caregiver. Some medicines may help relieve the pain. °· Tell your caregiver about any changes in your pain. °· Follow up with your caregiver as directed. °SEEK IMMEDIATE MEDICAL CARE IF:  °· Your pain is not controlled with medicine.   °· You have new or worsening symptoms. °· Your pain increases.   °· You have abdominal pain.   °· You have shortness of breath.   °· You have persistent nausea or vomiting.   °· You have swelling in your abdomen.   °· You feel faint or pass out.   °· You have blood in your urine. °· You have a fever or persistent symptoms for more than 2-3 days. °· You have a fever and your symptoms suddenly get worse. °MAKE SURE YOU:  °· Understand these instructions. °· Will watch your condition. °· Will get help right away if you are not doing well or get worse. °  °This information is not intended to replace advice given to you by your health care provider. Make sure you discuss any questions you have with your health care provider. °  °Document Released: 07/15/2005 Document  Revised: 02/16/2012 Document Reviewed: 01/06/2012 °Elsevier Interactive Patient Education ©2016 [employer] ° °

## 2015-07-04 NOTE — ED Notes (Signed)
Pt reports r flank pain radiating around to r groin since last night.  Reports nausea, no vomiting.  Reports history of kidney stones.

## 2015-07-15 ENCOUNTER — Emergency Department (HOSPITAL_COMMUNITY): Payer: Medicaid Other

## 2015-07-15 ENCOUNTER — Encounter (HOSPITAL_COMMUNITY): Payer: Self-pay

## 2015-07-15 ENCOUNTER — Emergency Department (HOSPITAL_COMMUNITY)
Admission: EM | Admit: 2015-07-15 | Discharge: 2015-07-15 | Disposition: A | Discharge status: Home / Self Care | Payer: Medicaid Other | Attending: Emergency Medicine | Admitting: Emergency Medicine

## 2015-07-15 DIAGNOSIS — Z87442 Personal history of urinary calculi: Secondary | ICD-10-CM | POA: Insufficient documentation

## 2015-07-15 DIAGNOSIS — J40 Bronchitis, not specified as acute or chronic: Secondary | ICD-10-CM

## 2015-07-15 DIAGNOSIS — J45901 Unspecified asthma with (acute) exacerbation: Secondary | ICD-10-CM | POA: Diagnosis not present

## 2015-07-15 DIAGNOSIS — F17210 Nicotine dependence, cigarettes, uncomplicated: Secondary | ICD-10-CM | POA: Diagnosis not present

## 2015-07-15 DIAGNOSIS — I10 Essential (primary) hypertension: Secondary | ICD-10-CM | POA: Diagnosis not present

## 2015-07-15 DIAGNOSIS — Z79899 Other long term (current) drug therapy: Secondary | ICD-10-CM | POA: Diagnosis not present

## 2015-07-15 DIAGNOSIS — R05 Cough: Secondary | ICD-10-CM | POA: Diagnosis present

## 2015-07-15 MED ORDER — IBUPROFEN 800 MG PO TABS
800.0000 mg | ORAL_TABLET | Freq: Once | ORAL | Status: AC
  Administered 2015-07-15: 800 mg via ORAL
  Filled 2015-07-15: qty 1

## 2015-07-15 MED ORDER — METHOCARBAMOL 500 MG PO TABS
1000.0000 mg | ORAL_TABLET | Freq: Once | ORAL | Status: AC
  Administered 2015-07-15: 1000 mg via ORAL
  Filled 2015-07-15: qty 2

## 2015-07-15 MED ORDER — DOXYCYCLINE HYCLATE 100 MG PO TABS
100.0000 mg | ORAL_TABLET | Freq: Once | ORAL | Status: AC
  Administered 2015-07-15: 100 mg via ORAL
  Filled 2015-07-15: qty 1

## 2015-07-15 MED ORDER — DOXYCYCLINE HYCLATE 100 MG PO CAPS: 100.0000 mg | ORAL_CAPSULE | Freq: Two times a day (BID) | ORAL | Status: DC

## 2015-07-15 MED ORDER — IBUPROFEN 600 MG PO TABS: 600.0000 mg | ORAL_TABLET | Freq: Four times a day (QID) | ORAL | Status: DC

## 2015-07-15 MED ORDER — DEXAMETHASONE 4 MG PO TABS: 4.0000 mg | ORAL_TABLET | Freq: Two times a day (BID) | ORAL | Status: DC

## 2015-07-15 MED ORDER — METHOCARBAMOL 500 MG PO TABS: 500.0000 mg | ORAL_TABLET | Freq: Three times a day (TID) | ORAL | Status: DC

## 2015-07-15 NOTE — ED Provider Notes (Signed)
CSN: [id number]     Arrival date & time 07/15/15  1027 History   First MD Initiated Contact with Patient 07/15/15 1051     Chief Complaint  Patient presents with  . Cough     (Consider location/radiation/quality/duration/timing/severity/associated sxs/prior Treatment) Patient is a 40 y.o. male presenting with cough. The history is provided by the patient.  Cough Cough characteristics:  Productive Sputum characteristics:  Yellow Severity:  Moderate Onset quality:  Gradual Duration:  1 week Timing:  Intermittent Progression:  Worsening Chronicity:  New Smoker: yes   Context: upper respiratory infection and weather changes   Context: not sick contacts   Relieved by:  Nothing Ineffective treatments:  None tried Associated symptoms: chest pain, myalgias and wheezing   Associated symptoms: no fever   Risk factors: no recent travel     Past Medical History  Diagnosis Date  . Asthma   . Kidney stone   . Kidney stones   . Hypertension    History reviewed. No pertinent past surgical history. Family History  Problem Relation Age of Onset  . Nephrolithiasis Father    Social History  Substance Use Topics  . Smoking status: Current Every Day Smoker -- 1.00 packs/day for 10 years    Types: Cigarettes  . Smokeless tobacco: Never Used  . Alcohol Use: Yes     Comment: Occasionally    Review of Systems  Constitutional: Negative for fever.  HENT: Positive for congestion.   Respiratory: Positive for cough and wheezing.   Cardiovascular: Positive for chest pain.  Musculoskeletal: Positive for myalgias.  All other systems reviewed and are negative.     Allergies  Review of patient's allergies indicates no known allergies.  Home Medications   Prior to Admission medications   Medication Sig Start Date End Date Taking? Authorizing Provider  albuterol (PROVENTIL HFA;VENTOLIN HFA) 108 (90 Base) MCG/ACT inhaler Inhale 2 puffs into the lungs every 6 (six) hours as needed for  wheezing or shortness of breath.    Historical Provider, MD  Aspirin-Salicylamide-Caffeine (BC HEADACHE POWDER PO) Take 2 packets by mouth.    Historical Provider, MD  lisinopril-hydrochlorothiazide (PRINZIDE,ZESTORETIC) 10-12.5 MG tablet Take 1 tablet by mouth daily.    Historical Provider, MD  lisinopril-hydrochlorothiazide (ZESTORETIC) 10-12.5 MG tablet Take 1 tablet by mouth daily. 04/06/15   Blane Ohara, MD  naproxen (NAPROSYN) 500 MG tablet Take 1 tablet (500 mg total) by mouth 2 (two) times daily. Patient not taking: Reported on 07/04/2015 05/02/15   Burgess Amor, PA-C   BP 123/88 mmHg  Pulse 91  Temp(Src) 98.4 F (36.9 C) (Oral)  Resp 18  Ht  (1.727 m)  Wt 68.04 kg  BMI 22.81 kg/m2  SpO2 100% Physical Exam  Constitutional: He is oriented to person, place, and time. He appears well-developed and well-nourished.  Non-toxic appearance.  HENT:  Head: Normocephalic.  Right Ear: Tympanic membrane and external ear normal.  Left Ear: Tympanic membrane and external ear normal.  Eyes: EOM and lids are normal. Pupils are equal, round, and reactive to light.  Neck: Normal range of motion. Neck supple. Carotid bruit is not present.  Cardiovascular: Normal rate, regular rhythm, normal heart sounds, intact distal pulses and normal pulses.   Pulmonary/Chest: No respiratory distress. He has rhonchi.  Abdominal: Soft. Bowel sounds are normal. There is no tenderness. There is no guarding.  Musculoskeletal: Normal range of motion.  Lymphadenopathy:       Head (right side): No submandibular adenopathy present.  Head (left side): No submandibular adenopathy present.    He has no cervical adenopathy.  Neurological: He is alert and oriented to person, place, and time. He has normal strength. No cranial nerve deficit or sensory deficit.  Skin: Skin is warm and dry.  Psychiatric: He has a normal mood and affect. His speech is normal.  Nursing note and vitals reviewed.   ED Course   Procedures (including critical care time) Labs Review Labs Reviewed - No data to display  Imaging Review No results found. I have personally reviewed and evaluated these images and lab results as part of my medical decision-making.   EKG Interpretation None      MDM  The vital signs are within normal limits. Pulse oximetry is 100% on room air. The chest x-ray is negative for acute problem. The rib x-rays are negative for fracture or dislocation. Suspect bronchitis with chest wall tenderness. The patient will be treated with Decadron , Robaxin, ibuprofen, and doxycycline. Patient to follow-up with his Medicaid access physician or return to the emergency department if any changes or problems.    Final diagnoses:  None    *I have reviewed nursing notes, vital signs, and all appropriate lab and imaging results for this patient.Ivery Quale, PA-C 07/15/15 1150  Linwood Dibbles, MD 07/15/15 [phone]

## 2015-07-15 NOTE — ED Notes (Signed)
Pt reports productive cough with yellow sputum x 1 week.  Pt c/o pain in left ribs.

## 2015-07-15 NOTE — Discharge Instructions (Signed)
Cough your chest x-ray is negative for pneumonia, collapsed lung, fractured rib, or other chest abnormality. Your examination favors a bronchitis, with chest wall tenderness. Please use doxycycline, Decadron, Robaxin, and ibuprofen daily. Please take these medications with food. Please see your Medicaid access physician for additional evaluation if not improving. Robaxin may cause drowsiness, please use this medication with caution. Upper Respiratory Infection, Adult Most upper respiratory infections (URIs) are caused by a virus. A URI affects the nose, throat, and upper air passages. The most common type of URI is often called "the common cold." HOME CARE   Take medicines only as told by your doctor.  Gargle warm saltwater or take cough drops to comfort your throat as told by your doctor.  Use a warm mist humidifier or inhale steam from a shower to increase air moisture. This may make it easier to breathe.  Drink enough fluid to keep your pee (urine) clear or pale yellow.  Eat soups and other clear broths.  Have a healthy diet.  Rest as needed.  Go back to work when your fever is gone or your doctor says it is okay.  You may need to stay home longer to avoid giving your URI to others.  You can also wear a face mask and wash your hands often to prevent spread of the virus.  Use your inhaler more if you have asthma.  Do not use any tobacco products, including cigarettes, chewing tobacco, or electronic cigarettes. If you need help quitting, ask your doctor. GET HELP IF:  You are getting worse, not better.  Your symptoms are not helped by medicine.  You have chills.  You are getting more short of breath.  You have brown or red mucus.  You have yellow or brown discharge from your nose.  You have pain in your face, especially when you bend forward.  You have a fever.  You have puffy (swollen) neck glands.  You have pain while swallowing.  You have white areas in the back  of your throat. GET HELP RIGHT AWAY IF:   You have very bad or constant:  Headache.  Ear pain.  Pain in your forehead, behind your eyes, and over your cheekbones (sinus pain).  Chest pain.  You have long-lasting (chronic) lung disease and any of the following:  Wheezing.  Long-lasting cough.  Coughing up blood.  A change in your usual mucus.  You have a stiff neck.  You have changes in your:  Vision.  Hearing.  Thinking.  Mood. MAKE SURE YOU:   Understand these instructions.  Will watch your condition.  Will get help right away if you are not doing well or get worse.   This information is not intended to replace advice given to you by your health care provider. Make sure you discuss any questions you have with your health care provider.   Document Released: 11/10/2007 Document Revised: 10/08/2014 Document Reviewed: 08/29/2013 Elsevier Interactive Patient Education [employer]

## 2015-07-24 ENCOUNTER — Emergency Department (HOSPITAL_COMMUNITY)
Admission: EM | Admit: 2015-07-24 | Discharge: 2015-07-24 | Disposition: A | Discharge status: Home / Self Care | Payer: Medicaid Other | Attending: Emergency Medicine | Admitting: Emergency Medicine

## 2015-07-24 ENCOUNTER — Encounter (HOSPITAL_COMMUNITY): Payer: Self-pay | Admitting: Emergency Medicine

## 2015-07-24 ENCOUNTER — Emergency Department (HOSPITAL_COMMUNITY): Payer: Medicaid Other

## 2015-07-24 DIAGNOSIS — I10 Essential (primary) hypertension: Secondary | ICD-10-CM | POA: Insufficient documentation

## 2015-07-24 DIAGNOSIS — R109 Unspecified abdominal pain: Secondary | ICD-10-CM | POA: Diagnosis not present

## 2015-07-24 DIAGNOSIS — J45909 Unspecified asthma, uncomplicated: Secondary | ICD-10-CM | POA: Diagnosis not present

## 2015-07-24 DIAGNOSIS — F17210 Nicotine dependence, cigarettes, uncomplicated: Secondary | ICD-10-CM | POA: Insufficient documentation

## 2015-07-24 NOTE — ED Notes (Signed)
No answer in waiting areas.

## 2015-07-24 NOTE — ED Notes (Signed)
Unable to locate pt in all waiting areas 

## 2015-07-24 NOTE — ED Notes (Signed)
Having flank pain for last 2 days, rates pain 8/10.

## 2015-07-24 NOTE — ED Notes (Signed)
Called to place patient in room. No answer.  

## 2015-07-25 ENCOUNTER — Encounter (HOSPITAL_COMMUNITY): Payer: Self-pay | Admitting: Emergency Medicine

## 2015-07-25 ENCOUNTER — Emergency Department (HOSPITAL_COMMUNITY): Payer: Medicaid Other

## 2015-07-25 ENCOUNTER — Emergency Department (HOSPITAL_COMMUNITY)
Admission: EM | Admit: 2015-07-25 | Discharge: 2015-07-25 | Disposition: A | Discharge status: Home / Self Care | Payer: Medicaid Other | Attending: Emergency Medicine | Admitting: Emergency Medicine

## 2015-07-25 DIAGNOSIS — Z87442 Personal history of urinary calculi: Secondary | ICD-10-CM | POA: Diagnosis not present

## 2015-07-25 DIAGNOSIS — Z79899 Other long term (current) drug therapy: Secondary | ICD-10-CM | POA: Insufficient documentation

## 2015-07-25 DIAGNOSIS — F17210 Nicotine dependence, cigarettes, uncomplicated: Secondary | ICD-10-CM | POA: Diagnosis not present

## 2015-07-25 DIAGNOSIS — J45909 Unspecified asthma, uncomplicated: Secondary | ICD-10-CM | POA: Diagnosis not present

## 2015-07-25 DIAGNOSIS — R109 Unspecified abdominal pain: Secondary | ICD-10-CM

## 2015-07-25 DIAGNOSIS — I10 Essential (primary) hypertension: Secondary | ICD-10-CM | POA: Insufficient documentation

## 2015-07-25 LAB — BASIC METABOLIC PANEL
Anion gap: 9 (ref 5–15)
BUN: 20 mg/dL (ref 6–20)
CHLORIDE: 107 mmol/L (ref 101–111)
CO2: 23 mmol/L (ref 22–32)
CREATININE: 1.26 mg/dL — AB (ref 0.61–1.24)
Calcium: 9.1 mg/dL (ref 8.9–10.3)
GFR calc Af Amer: >60 mL/min (ref >60)
GFR calc non Af Amer: >60 mL/min (ref >60)
Glucose, Bld: 140 mg/dL — ABNORMAL HIGH (ref 65–99)
Potassium: 4 mmol/L (ref 3.5–5.1)
Sodium: 139 mmol/L (ref 135–145)

## 2015-07-25 LAB — CBC
HEMATOCRIT: 41.3 % (ref 39.0–52.0)
Hemoglobin: 13.9 g/dL (ref 13.0–17.0)
MCH: 30.5 pg (ref 26.0–34.0)
MCHC: 33.7 g/dL (ref 30.0–36.0)
MCV: 90.8 fL (ref 78.0–100.0)
Platelets: 311 10*3/uL (ref 150–400)
RBC: 4.55 MIL/uL (ref 4.22–5.81)
RDW: 13.8 % (ref 11.5–15.5)
WBC: 11.7 10*3/uL — AB (ref 4.0–10.5)

## 2015-07-25 LAB — URINALYSIS, ROUTINE W REFLEX MICROSCOPIC
BILIRUBIN URINE: NEGATIVE
Glucose, UA: NEGATIVE mg/dL
Hgb urine dipstick: NEGATIVE
Ketones, ur: NEGATIVE mg/dL
Leukocytes, UA: NEGATIVE
NITRITE: NEGATIVE
PH: 5 (ref 5.0–8.0)
Protein, ur: NEGATIVE mg/dL
SPECIFIC GRAVITY, URINE: 1.025 (ref 1.005–1.030)

## 2015-07-25 MED ORDER — IBUPROFEN 600 MG PO TABS: 600.0000 mg | ORAL_TABLET | Freq: Four times a day (QID) | ORAL | Status: DC | PRN

## 2015-07-25 MED ORDER — CYCLOBENZAPRINE HCL 10 MG PO TABS: 10.0000 mg | ORAL_TABLET | Freq: Three times a day (TID) | ORAL | Status: DC | PRN

## 2015-07-25 NOTE — Discharge Instructions (Signed)
Flank Pain °Flank pain refers to pain that is located on the side of the body between the upper abdomen and the back. The pain may occur over a short period of time (acute) or may be long-term or reoccurring (chronic). It may be mild or severe. Flank pain can be caused by many things. °CAUSES  °Some of the more common causes of flank pain include: °· Muscle strains.   °· Muscle spasms.   °· A disease of your spine (vertebral disk disease).   °· A lung infection (pneumonia).   °· Fluid around your lungs (pulmonary edema).   °· A kidney infection.   °· Kidney stones.   °· A very painful skin rash caused by the chickenpox virus (shingles).   °· Gallbladder disease.   °HOME CARE INSTRUCTIONS  °Home care will depend on the cause of your pain. In general, °· Rest as directed by your caregiver. °· Drink enough fluids to keep your urine clear or pale yellow. °· Only take over-the-counter or prescription medicines as directed by your caregiver. Some medicines may help relieve the pain. °· Tell your caregiver about any changes in your pain. °· Follow up with your caregiver as directed. °SEEK IMMEDIATE MEDICAL CARE IF:  °· Your pain is not controlled with medicine.   °· You have new or worsening symptoms. °· Your pain increases.   °· You have abdominal pain.   °· You have shortness of breath.   °· You have persistent nausea or vomiting.   °· You have swelling in your abdomen.   °· You feel faint or pass out.   °· You have blood in your urine. °· You have a fever or persistent symptoms for more than 2-3 days. °· You have a fever and your symptoms suddenly get worse. °MAKE SURE YOU:  °· Understand these instructions. °· Will watch your condition. °· Will get help right away if you are not doing well or get worse. °  °This information is not intended to replace advice given to you by your health care provider. Make sure you discuss any questions you have with your health care provider. °  °Document Released: 07/15/2005 Document  Revised: 02/16/2012 Document Reviewed: 01/06/2012 °Elsevier Interactive Patient Education ©2016 [employer] ° °

## 2015-07-25 NOTE — ED Provider Notes (Signed)
CSN: [id number]     Arrival date & time 07/25/15  1413 History   First MD Initiated Contact with Patient 07/25/15 1429     Chief Complaint  Patient presents with  . Flank Pain      Patient is a 40 y.o. male presenting with flank pain. The history is provided by the patient.  Flank Pain This is a recurrent problem. Pertinent negatives include no chest pain, no abdominal pain, no headaches and no shortness of breath.  patient present with left flank pain. He has had it for the last 3 days. It is dull and constant. It is similar to previous pains that he has had in the past. Worse with certain positions. No trauma. No fever. No dysuria. No abdominal pain. He was seen for pain like the 3 weeks ago by me and had negative urine and renal ultrasound at that time. Pain came back around 3 days ago. Denies fevers or chills. Denies diarrhea.  Past Medical History  Diagnosis Date  . Asthma   . Kidney stone   . Kidney stones   . Hypertension    History reviewed. No pertinent past surgical history. Family History  Problem Relation Age of Onset  . Nephrolithiasis Father    Social History  Substance Use Topics  . Smoking status: Current Every Day Smoker -- 1.00 packs/day for 10 years    Types: Cigarettes  . Smokeless tobacco: Never Used  . Alcohol Use: Yes     Comment: Occasionally    Review of Systems  Constitutional: Negative for activity change and appetite change.  Eyes: Negative for pain.  Respiratory: Negative for chest tightness and shortness of breath.   Cardiovascular: Negative for chest pain and leg swelling.  Gastrointestinal: Negative for nausea, vomiting, abdominal pain and diarrhea.  Genitourinary: Positive for flank pain.  Musculoskeletal: Negative for back pain and neck stiffness.  Skin: Negative for rash.  Neurological: Negative for weakness, numbness and headaches.  Psychiatric/Behavioral: Negative for behavioral problems.      Allergies  Review of patient's  allergies indicates no known allergies.  Home Medications   Prior to Admission medications   Medication Sig Start Date End Date Taking? Authorizing Provider  albuterol (PROVENTIL HFA;VENTOLIN HFA) 108 (90 Base) MCG/ACT inhaler Inhale 2 puffs into the lungs every 6 (six) hours as needed for wheezing or shortness of breath.   Yes Historical Provider, MD  Aspirin-Salicylamide-Caffeine (BC HEADACHE POWDER PO) Take 2 packets by mouth.   Yes Historical Provider, MD  lisinopril-hydrochlorothiazide (ZESTORETIC) 10-12.5 MG tablet Take 1 tablet by mouth daily. 04/06/15  Yes Blane Ohara, MD  cyclobenzaprine (FLEXERIL) 10 MG tablet Take 1 tablet (10 mg total) by mouth 3 (three) times daily as needed for muscle spasms. 07/25/15   Benjiman Core, MD  dexamethasone (DECADRON) 4 MG tablet Take 1 tablet (4 mg total) by mouth 2 (two) times daily with a meal. Patient not taking: Reported on 07/25/2015 07/15/15   Ivery Quale, PA-C  doxycycline (VIBRAMYCIN) 100 MG capsule Take 1 capsule (100 mg total) by mouth 2 (two) times daily. Patient not taking: Reported on 07/25/2015 07/15/15   Ivery Quale, PA-C  ibuprofen (ADVIL,MOTRIN) 600 MG tablet Take 1 tablet (600 mg total) by mouth every 6 (six) hours as needed. 07/25/15   Benjiman Core, MD  lisinopril-hydrochlorothiazide (PRINZIDE,ZESTORETIC) 10-12.5 MG tablet Take 1 tablet by mouth daily.    Historical Provider, MD   BP 129/84 mmHg  Pulse 133  Temp(Src) 99.4 F (37.4 C) (Rectal)  Resp 20  Ht  (1.727 m)  Wt 150 lb (68.04 kg)  BMI 22.81 kg/m2  SpO2 100% Physical Exam  Constitutional: He appears well-developed.  Patient appears uncomfortable  HENT:  Head: Atraumatic.  Neck: Neck supple.  Cardiovascular: Normal rate and regular rhythm.   Pulmonary/Chest: Effort normal.  Abdominal: Soft.  Genitourinary:  No rash or CVA tenderness.  Neurological: He is alert.  Skin: Skin is warm.    ED Course  Procedures (including critical care time) Labs  Review Labs Reviewed  CBC - Abnormal; Notable for the following:    WBC 11.7 (*)    All other components within normal limits  BASIC METABOLIC PANEL - Abnormal; Notable for the following:    Glucose, Bld 140 (*)    Creatinine, Ser 1.26 (*)    All other components within normal limits  URINE CULTURE  URINALYSIS, ROUTINE W REFLEX MICROSCOPIC (NOT AT [facility])    Imaging Review No results found. I have personally reviewed and evaluated these images and lab results as part of my medical decision-making.   EKG Interpretation None      MDM   Final diagnoses:  Flank pain    Flank pain. History of same. Feels like previous renal stones. No hydro-on ultrasound. Urine does not show infection. White count is minimally elevated otherwise labs reassuring. Will discharge home.    Benjiman Core, MD 07/28/15 [phone]

## 2015-07-25 NOTE — ED Provider Notes (Signed)
CSN: [id number]     Arrival date & time 07/25/15  1413 History   First MD Initiated Contact with Patient 07/25/15 1429     Chief Complaint  Patient presents with  . Flank Pain     (Consider location/radiation/quality/duration/timing/severity/associated sxs/prior Treatment) HPI  Past Medical History  Diagnosis Date  . Asthma   . Kidney stone   . Kidney stones   . Hypertension    History reviewed. No pertinent past surgical history. Family History  Problem Relation Age of Onset  . Nephrolithiasis Father    Social History  Substance Use Topics  . Smoking status: Current Every Day Smoker -- 1.00 packs/day for 10 years    Types: Cigarettes  . Smokeless tobacco: Never Used  . Alcohol Use: Yes     Comment: Occasionally    Review of Systems    Allergies  Review of patient's allergies indicates no known allergies.  Home Medications   Prior to Admission medications   Medication Sig Start Date End Date Taking? Authorizing Provider  albuterol (PROVENTIL HFA;VENTOLIN HFA) 108 (90 Base) MCG/ACT inhaler Inhale 2 puffs into the lungs every 6 (six) hours as needed for wheezing or shortness of breath.   Yes Historical Provider, MD  Aspirin-Salicylamide-Caffeine (BC HEADACHE POWDER PO) Take 2 packets by mouth.   Yes Historical Provider, MD  lisinopril-hydrochlorothiazide (ZESTORETIC) 10-12.5 MG tablet Take 1 tablet by mouth daily. 04/06/15  Yes Blane Ohara, MD  cyclobenzaprine (FLEXERIL) 10 MG tablet Take 1 tablet (10 mg total) by mouth 3 (three) times daily as needed for muscle spasms. 07/25/15   Benjiman Core, MD  dexamethasone (DECADRON) 4 MG tablet Take 1 tablet (4 mg total) by mouth 2 (two) times daily with a meal. Patient not taking: Reported on 07/25/2015 07/15/15   Ivery Quale, PA-C  doxycycline (VIBRAMYCIN) 100 MG capsule Take 1 capsule (100 mg total) by mouth 2 (two) times daily. Patient not taking: Reported on 07/25/2015 07/15/15   Ivery Quale, PA-C  ibuprofen  (ADVIL,MOTRIN) 600 MG tablet Take 1 tablet (600 mg total) by mouth every 6 (six) hours as needed. 07/25/15   Benjiman Core, MD  lisinopril-hydrochlorothiazide (PRINZIDE,ZESTORETIC) 10-12.5 MG tablet Take 1 tablet by mouth daily.    Historical Provider, MD   BP 129/84 mmHg  Pulse 133  Temp(Src) 99.4 F (37.4 C) (Rectal)  Resp 20  Ht  (1.727 m)  Wt 150 lb (68.04 kg)  BMI 22.81 kg/m2  SpO2 100% Physical Exam  ED Course  Procedures (including critical care time) Labs Review Labs Reviewed  CBC - Abnormal; Notable for the following:    WBC 11.7 (*)    All other components within normal limits  BASIC METABOLIC PANEL - Abnormal; Notable for the following:    Glucose, Bld 140 (*)    Creatinine, Ser 1.26 (*)    All other components within normal limits  URINE CULTURE  URINALYSIS, ROUTINE W REFLEX MICROSCOPIC (NOT AT [facility])    Imaging Review US Renal  07/25/2015  CLINICAL DATA:  Left flank pain EXAM: RENAL / URINARY TRACT ULTRASOUND COMPLETE COMPARISON:  07/04/2015 FINDINGS: Right Kidney: Length: 11.9 cm. Normal parenchymal echogenicity. Small cyst in the upper pole measuring 7 mm, not seen prior exam. Echogenic foci are noted most likely nonobstructing intrarenal stones. These could be vascular calcifications no hydronephrosis. No solid renal masses. The Left Kidney: Length: 11.6 cm. Normal parenchymal echogenicity. No masses. No hydronephrosis. Small echogenic foci consistent with nonobstructing intrarenal or vascular calcifications or a combination. Bladder: Decompressed and  not well evaluated. IMPRESSION: 1. No acute finding.  No hydronephrosis. 2. Small echogenic foci each kidney with nonobstructing intrarenal stones, vascular calcifications or a combination. These are stable. 3. Small right renal cyst not seen previously.  No other change. Electronically Signed   By: Amie Portland M.D.   On: 07/25/2015 15:36   I have personally reviewed and evaluated these images and lab results as  part of my medical decision-making.   EKG Interpretation None      MDM   Final diagnoses:  Flank pain    Patient flank pain. Previous visits for same. Ultrasound shows likely nonobstructive stones. No infection. Has had previous visits for same. Will discharge home.    Benjiman Core, MD 07/25/15 [phone]

## 2015-07-25 NOTE — ED Notes (Signed)
Pt c/o L flank pain that became worse 2 days ago.

## 2015-07-27 LAB — URINE CULTURE: SPECIAL REQUESTS: NORMAL

## 2015-08-03 ENCOUNTER — Encounter (HOSPITAL_COMMUNITY): Payer: Self-pay | Admitting: Emergency Medicine

## 2015-08-03 ENCOUNTER — Emergency Department (HOSPITAL_COMMUNITY)
Admission: EM | Admit: 2015-08-03 | Discharge: 2015-08-03 | Disposition: A | Discharge status: Home / Self Care | Payer: Medicaid Other | Attending: Emergency Medicine | Admitting: Emergency Medicine

## 2015-08-03 DIAGNOSIS — R51 Headache: Secondary | ICD-10-CM | POA: Insufficient documentation

## 2015-08-03 DIAGNOSIS — I10 Essential (primary) hypertension: Secondary | ICD-10-CM | POA: Insufficient documentation

## 2015-08-03 DIAGNOSIS — Z87442 Personal history of urinary calculi: Secondary | ICD-10-CM | POA: Insufficient documentation

## 2015-08-03 DIAGNOSIS — K029 Dental caries, unspecified: Secondary | ICD-10-CM | POA: Diagnosis not present

## 2015-08-03 DIAGNOSIS — J45909 Unspecified asthma, uncomplicated: Secondary | ICD-10-CM | POA: Insufficient documentation

## 2015-08-03 DIAGNOSIS — R0981 Nasal congestion: Secondary | ICD-10-CM | POA: Diagnosis not present

## 2015-08-03 DIAGNOSIS — F17210 Nicotine dependence, cigarettes, uncomplicated: Secondary | ICD-10-CM | POA: Diagnosis not present

## 2015-08-03 DIAGNOSIS — K0889 Other specified disorders of teeth and supporting structures: Secondary | ICD-10-CM | POA: Diagnosis present

## 2015-08-03 DIAGNOSIS — Z79899 Other long term (current) drug therapy: Secondary | ICD-10-CM | POA: Insufficient documentation

## 2015-08-03 MED ORDER — HYDROCODONE-ACETAMINOPHEN 5-325 MG PO TABS
1.0000 | ORAL_TABLET | Freq: Once | ORAL | Status: AC
  Administered 2015-08-03: 1 via ORAL
  Filled 2015-08-03: qty 1

## 2015-08-03 MED ORDER — AMOXICILLIN 250 MG PO CAPS
500.0000 mg | ORAL_CAPSULE | Freq: Once | ORAL | Status: AC
  Administered 2015-08-03: 500 mg via ORAL
  Filled 2015-08-03: qty 2

## 2015-08-03 MED ORDER — CEPHALEXIN 500 MG PO CAPS: 500.0000 mg | ORAL_CAPSULE | Freq: Three times a day (TID) | ORAL | Status: DC

## 2015-08-03 MED ORDER — NAPROXEN 500 MG PO TABS: 500.0000 mg | ORAL_TABLET | Freq: Two times a day (BID) | ORAL | Status: DC

## 2015-08-03 NOTE — Discharge Instructions (Signed)
Dental Care and Dentist Visits °Dental care supports good overall health. Regular dental visits can also help you avoid dental pain, bleeding, infection, and other more serious health problems in the future. It is important to keep the mouth healthy because diseases in the teeth, gums, and other oral tissues can spread to other areas of the body. Some problems, such as diabetes, heart disease, and pre-term labor have been associated with poor oral health.  °See your dentist every 6 months. If you experience emergency problems such as a toothache or broken tooth, go to the dentist right away. If you see your dentist regularly, you may catch problems early. It is easier to be treated for problems in the early stages.  °WHAT TO EXPECT AT A DENTIST VISIT  °Your dentist will look for many common oral health problems and recommend proper treatment. At your regular dental visit, you can expect: °· Gentle cleaning of the teeth and gums. This includes scraping and polishing. This helps to remove the sticky substance around the teeth and gums (plaque). Plaque forms in the mouth shortly after eating. Over time, plaque hardens on the teeth as tartar. If tartar is not removed regularly, it can cause problems. Cleaning also helps remove stains. °· Periodic X-rays. These pictures of the teeth and supporting bone will help your dentist assess the health of your teeth. °· Periodic fluoride treatments. Fluoride is a natural mineral shown to help strengthen teeth. Fluoride treatment involves applying a fluoride gel or varnish to the teeth. It is most commonly done in children. °· Examination of the mouth, tongue, jaws, teeth, and gums to look for any oral health problems, such as: °· Cavities (dental caries). This is decay on the tooth caused by plaque, sugar, and acid in the mouth. It is best to catch a cavity when it is small. °· Inflammation of the gums caused by plaque buildup (gingivitis). °· Problems with the mouth or malformed  or misaligned teeth. °· Oral cancer or other diseases of the soft tissues or jaws.  °KEEP YOUR TEETH AND GUMS HEALTHY °For healthy teeth and gums, follow these general guidelines as well as your dentist's specific advice: °· Have your teeth professionally cleaned at the dentist every 6 months. °· Brush twice daily with a fluoride toothpaste. °· Floss your teeth daily.  °· Ask your dentist if you need fluoride supplements, treatments, or fluoride toothpaste. °· Eat a healthy diet. Reduce foods and drinks with added sugar. °· Avoid smoking. °TREATMENT FOR ORAL HEALTH PROBLEMS °If you have oral health problems, treatment varies depending on the conditions present in your teeth and gums. °· Your caregiver will most likely recommend good oral hygiene at each visit. °· For cavities, gingivitis, or other oral health disease, your caregiver will perform a procedure to treat the problem. This is typically done at a separate appointment. Sometimes your caregiver will refer you to another dental specialist for specific tooth problems or for surgery. °SEEK IMMEDIATE DENTAL CARE IF: °· You have pain, bleeding, or soreness in the gum, tooth, jaw, or mouth area. °· A permanent tooth becomes loose or separated from the gum socket. °· You experience a blow or injury to the mouth or jaw area. °  °This information is not intended to replace advice given to you by your health care provider. Make sure you discuss any questions you have with your health care provider. °  °Document Released: 02/03/2011 Document Revised: 08/16/2011 Document Reviewed: 02/03/2011 °Elsevier Interactive Patient Education ©2016 [employer] ° °Dental Caries °Dental   caries is tooth decay. This decay can cause a hole in teeth (cavity) that can get bigger and deeper over time. HOME CARE  Brush and floss your teeth. Do this at least two times a day.  Use a fluoride toothpaste.  Use a mouth rinse if told by your dentist or doctor.  Eat less sugary and  starchy foods. Drink less sugary drinks.  Avoid snacking often on sugary and starchy foods. Avoid sipping often on sugary drinks.  Keep regular checkups and cleanings with your dentist.  Use fluoride supplements if told by your dentist or doctor.  Allow fluoride to be applied to teeth if told by your dentist or doctor.   This information is not intended to replace advice given to you by your health care provider. Make sure you discuss any questions you have with your health care provider.   Document Released: 03/02/2008 Document Revised: 06/14/2014 Document Reviewed: 05/26/2012 Elsevier Interactive Patient Education [employer]

## 2015-08-03 NOTE — ED Notes (Signed)
Patient complaining of nasal congestion x 2 days with facial pain starting yesterday.

## 2015-08-03 NOTE — ED Notes (Signed)
Pt instructed not to drive for at least next 4 hours due to pain med given in ED today, also instructed to take all of antibiotics as directed and that he will not have any left over.  Pt received list of dental services in county as [place] to f/u with

## 2015-08-03 NOTE — ED Provider Notes (Signed)
CSN: [id number]     Arrival date & time 08/03/15  1553 History   First MD Initiated Contact with Patient 08/03/15 1614     Chief Complaint  Patient presents with  . Nasal Congestion  . Facial Pain     (Consider location/radiation/quality/duration/timing/severity/associated sxs/prior Treatment) Patient is a 40 y.o. male presenting with tooth pain. The history is provided by the patient.  Dental Pain Location:  Lower Lower teeth location:  23/LL lateral incisor, 24/LL central incisor, 25/RL central incisor and 26/RL lateral incisor Quality:  Throbbing Severity:  Severe Onset quality:  Gradual Duration:  1 day Timing:  Constant Progression:  Worsening Chronicity:  Recurrent Context: dental caries and poor dentition   Relieved by:  Nothing Worsened by:  Cold food/drink and touching Ineffective treatments:  Acetaminophen, NSAIDs and topical anesthetic gel Associated symptoms: congestion, facial pain and gum swelling   Risk factors: lack of dental care and smoking    Alan Park is a 40 y.o. male who presents to the ED with dental pain that started yesterday in the lower front. He also complains of nasal congestion that is clear that started 2 days ago.    Past Medical History  Diagnosis Date  . Asthma   . Kidney stone   . Kidney stones   . Hypertension    History reviewed. No pertinent past surgical history. Family History  Problem Relation Age of Onset  . Nephrolithiasis Father    Social History  Substance Use Topics  . Smoking status: Current Every Day Smoker -- 1.00 packs/day for 10 years    Types: Cigarettes  . Smokeless tobacco: Never Used  . Alcohol Use: Yes     Comment: Occasionally    Review of Systems  HENT: Positive for congestion and dental problem.   Hematological: Positive for adenopathy.   All other systems negataive   Allergies  Review of patient's allergies indicates no known allergies.  Home Medications   Prior to Admission  medications   Medication Sig Start Date End Date Taking? Authorizing Provider  albuterol (PROVENTIL HFA;VENTOLIN HFA) 108 (90 Base) MCG/ACT inhaler Inhale 2 puffs into the lungs every 6 (six) hours as needed for wheezing or shortness of breath.    Historical Provider, MD  Aspirin-Salicylamide-Caffeine (BC HEADACHE POWDER PO) Take 2 packets by mouth.    Historical Provider, MD  cephALEXin (KEFLEX) 500 MG capsule Take 1 capsule (500 mg total) by mouth 3 (three) times daily. 08/03/15   Maylen Waltermire Orlene Och, NP  cyclobenzaprine (FLEXERIL) 10 MG tablet Take 1 tablet (10 mg total) by mouth 3 (three) times daily as needed for muscle spasms. 07/25/15   Benjiman Core, MD  lisinopril-hydrochlorothiazide (PRINZIDE,ZESTORETIC) 10-12.5 MG tablet Take 1 tablet by mouth daily.    Historical Provider, MD  lisinopril-hydrochlorothiazide (ZESTORETIC) 10-12.5 MG tablet Take 1 tablet by mouth daily. 04/06/15   Blane Ohara, MD  naproxen (NAPROSYN) 500 MG tablet Take 1 tablet (500 mg total) by mouth 2 (two) times daily. 08/03/15   Manami Tutor Orlene Och, NP   BP 130/91 mmHg  Pulse 100  Temp(Src) 97.8 F (36.6 C) (Oral)  Resp 18  Ht  (1.727 m)  Wt 68.04 kg  BMI 22.81 kg/m2  SpO2 100% Physical Exam  Constitutional: He is oriented to person, place, and time. He appears well-developed and well-nourished. No distress.  HENT:  Head: Normocephalic and atraumatic.  Mouth/Throat: Uvula is midline and oropharynx is clear and moist.    Decayed teeth with erythema of the gum surrounding  the teeth. Tender on exam.   Eyes: Conjunctivae and EOM are normal. Pupils are equal, round, and reactive to light.  Neck: Normal range of motion. Neck supple.  Cardiovascular: Normal rate and regular rhythm.   Pulmonary/Chest: Effort normal and breath sounds normal.  Abdominal: Soft. There is no tenderness.  Musculoskeletal: Normal range of motion.  Lymphadenopathy:    He has cervical adenopathy.  Neurological: He is alert and oriented to  person, place, and time. No cranial nerve deficit.  Skin: Skin is warm and dry.  Psychiatric: He has a normal mood and affect. His behavior is normal.  Nursing note and vitals reviewed.   ED Course  Procedures   MDM  40 y.o. male with dental pain and nasal congestion that started yesterday stable for d/c without fever and does not appear toxic. Will treat with antibiotics and NSAIDS and patient to follow up with a dentist as soon as possible. Patient given information on dental clinics.   Final diagnoses:  Pain due to dental caries  Nasal congestion       Janne Napoleon, NP 08/03/15 1646  Samuel Jester, DO 08/06/15 2121

## 2015-08-05 ENCOUNTER — Encounter (HOSPITAL_COMMUNITY): Payer: Self-pay | Admitting: Emergency Medicine

## 2015-08-05 ENCOUNTER — Emergency Department (HOSPITAL_COMMUNITY)
Admission: EM | Admit: 2015-08-05 | Discharge: 2015-08-05 | Disposition: A | Discharge status: Home / Self Care | Payer: Medicaid Other | Attending: Emergency Medicine | Admitting: Emergency Medicine

## 2015-08-05 DIAGNOSIS — Z79899 Other long term (current) drug therapy: Secondary | ICD-10-CM | POA: Insufficient documentation

## 2015-08-05 DIAGNOSIS — Z87442 Personal history of urinary calculi: Secondary | ICD-10-CM | POA: Diagnosis not present

## 2015-08-05 DIAGNOSIS — Z792 Long term (current) use of antibiotics: Secondary | ICD-10-CM | POA: Diagnosis not present

## 2015-08-05 DIAGNOSIS — K029 Dental caries, unspecified: Secondary | ICD-10-CM | POA: Insufficient documentation

## 2015-08-05 DIAGNOSIS — I10 Essential (primary) hypertension: Secondary | ICD-10-CM | POA: Insufficient documentation

## 2015-08-05 DIAGNOSIS — J45909 Unspecified asthma, uncomplicated: Secondary | ICD-10-CM | POA: Insufficient documentation

## 2015-08-05 DIAGNOSIS — Z791 Long term (current) use of non-steroidal anti-inflammatories (NSAID): Secondary | ICD-10-CM | POA: Diagnosis not present

## 2015-08-05 DIAGNOSIS — K0889 Other specified disorders of teeth and supporting structures: Secondary | ICD-10-CM | POA: Insufficient documentation

## 2015-08-05 DIAGNOSIS — F17210 Nicotine dependence, cigarettes, uncomplicated: Secondary | ICD-10-CM | POA: Insufficient documentation

## 2015-08-05 MED ORDER — IBUPROFEN 600 MG PO TABS: 600.0000 mg | ORAL_TABLET | Freq: Four times a day (QID) | ORAL | Status: DC | PRN

## 2015-08-05 MED ORDER — PENICILLIN V POTASSIUM 500 MG PO TABS: 500.0000 mg | ORAL_TABLET | Freq: Four times a day (QID) | ORAL | Status: DC

## 2015-08-05 MED ORDER — KETOROLAC TROMETHAMINE 60 MG/2ML IM SOLN
60.0000 mg | Freq: Once | INTRAMUSCULAR | Status: AC
  Administered 2015-08-05: 60 mg via INTRAMUSCULAR
  Filled 2015-08-05: qty 2

## 2015-08-05 MED ORDER — PENICILLIN V POTASSIUM 250 MG PO TABS
500.0000 mg | ORAL_TABLET | Freq: Once | ORAL | Status: AC
Start: 2015-08-05 — End: 2015-08-05
  Administered 2015-08-05: 500 mg via ORAL
  Filled 2015-08-05: qty 2

## 2015-08-05 NOTE — ED Notes (Signed)
Patient complaining of lower dental pain x 2 days.

## 2015-08-05 NOTE — ED Provider Notes (Signed)
CSN: [id number]     Arrival date & time 08/05/15  0028 History   First MD Initiated Contact with Patient 08/05/15 0255    Chief Complaint  Patient presents with  . Dental Pain     (Consider location/radiation/quality/duration/timing/severity/associated sxs/prior Treatment) HPI patient states he started having pain in his lower teeth about 3 days ago. He was seen in the ED yesterday and started on naproxen and cephalexin. He states it throbs when he tries to go to sleep. He thinks maybe the swelling may be a little bit worse. He denies any fever. He states he has some mild discomfort in his throat when he swallows. Patient was given a dental referral information sheet yesterday.  PCP none  Past Medical History  Diagnosis Date  . Asthma   . Kidney stone   . Kidney stones   . Hypertension    History reviewed. No pertinent past surgical history. Family History  Problem Relation Age of Onset  . Nephrolithiasis Father    Social History  Substance Use Topics  . Smoking status: Current Every Day Smoker -- 1.00 packs/day for 10 years    Types: Cigarettes  . Smokeless tobacco: Never Used  . Alcohol Use: Yes     Comment: Occasionally    Review of Systems  All other systems reviewed and are negative.     Allergies  Review of patient's allergies indicates no known allergies.  Home Medications   Prior to Admission medications   Medication Sig Start Date End Date Taking? Authorizing Provider  albuterol (PROVENTIL HFA;VENTOLIN HFA) 108 (90 Base) MCG/ACT inhaler Inhale 2 puffs into the lungs every 6 (six) hours as needed for wheezing or shortness of breath.    Historical Provider, MD  Aspirin-Salicylamide-Caffeine (BC HEADACHE POWDER PO) Take 2 packets by mouth.    Historical Provider, MD  cephALEXin (KEFLEX) 500 MG capsule Take 1 capsule (500 mg total) by mouth 3 (three) times daily. 08/03/15   Hope Orlene Och, NP  cyclobenzaprine (FLEXERIL) 10 MG tablet Take 1 tablet (10 mg total) by  mouth 3 (three) times daily as needed for muscle spasms. 07/25/15   Benjiman Core, MD  ibuprofen (ADVIL,MOTRIN) 600 MG tablet Take 1 tablet (600 mg total) by mouth every 6 (six) hours as needed. 08/05/15   [name], MD  lisinopril-hydrochlorothiazide (PRINZIDE,ZESTORETIC) 10-12.5 MG tablet Take 1 tablet by mouth daily.    Historical Provider, MD  lisinopril-hydrochlorothiazide (ZESTORETIC) 10-12.5 MG tablet Take 1 tablet by mouth daily. 04/06/15   Blane Ohara, MD  naproxen (NAPROSYN) 500 MG tablet Take 1 tablet (500 mg total) by mouth 2 (two) times daily. 08/03/15   Hope Orlene Och, NP  penicillin v potassium (VEETID) 500 MG tablet Take 1 tablet (500 mg total) by mouth 4 (four) times daily. 08/05/15   [name], MD   BP 126/98 mmHg  Pulse 107  Temp(Src) 98.1 F (36.7 C) (Oral)  Resp 16  Ht  (1.727 m)  Wt 150 lb (68.04 kg)  BMI 22.81 kg/m2  SpO2 100%  Vital signs normal except tachycardia  Physical Exam  Constitutional: He is oriented to person, place, and time. He appears well-developed and well-nourished.  Non-toxic appearance. He does not appear ill. No distress.  HENT:  Head: Normocephalic and atraumatic.  Right Ear: External ear normal.  Left Ear: External ear normal.  Nose: Nose normal. No mucosal edema or rhinorrhea.  Mouth/Throat: Oropharynx is clear and moist and mucous membranes are normal. No dental abscesses or uvula swelling.  Patient has extremely poor dentition with lots of loss of teeth. His lower incisors in his left premolars are gray in color with lots of Tarter and decay. There is may be mild swelling of the gums but there is no localized swelling or redness seen. He indicates all the teeth are painful.  Eyes: Conjunctivae and EOM are normal.  Neck: Normal range of motion and full passive range of motion without pain.  There is no obvious swelling visualized or felt  Pulmonary/Chest: Effort normal. No respiratory distress. He has no rhonchi. He exhibits no  crepitus.  Abdominal: Normal appearance.  Musculoskeletal: Normal range of motion.  Moves all extremities well.   Neurological: He is alert and oriented to person, place, and time. He has normal strength. No cranial nerve deficit.  Skin: Skin is warm, dry and intact. No rash noted. No erythema. No pallor.  Psychiatric: He has a normal mood and affect. His speech is normal and behavior is normal. His mood appears not anxious.  Nursing note and vitals reviewed.   ED Course  Procedures (including critical care time)  Medications  ketorolac (TORADOL) injection 60 mg (60 mg Intramuscular Given 08/05/15 0244)  penicillin v potassium (VEETID) tablet 500 mg (500 mg Oral Given 08/05/15 0244)   He states his wife called the dental resources he was given yesterday and he has to wait several weeks to be seen. We also discussed he is going to be losing all of his teeth, he might consider talking to a denture services office about getting his teeth pulled. Given Toradol IM for pain and started on Pen-Vee K.   MDM   Final diagnoses:  Tooth ache   New Prescriptions   IBUPROFEN (ADVIL,MOTRIN) 600 MG TABLET    Take 1 tablet (600 mg total) by mouth every 6 (six) hours as needed.   PENICILLIN V POTASSIUM (VEETID) 500 MG TABLET    Take 1 tablet (500 mg total) by mouth 4 (four) times daily.    Plan discharge  [name], MD, Concha Pyo, MD 08/05/15 [phone]

## 2015-08-05 NOTE — Discharge Instructions (Signed)
Take the antibiotic until gone. You can either take the ibuprofen OR the naproxen. You can also take acetaminophen 1000 mg 4 times a day for pain. You need to see a dentist to get definitive care of your teeth. Consider talking to a denture office about getting your teeth pulled, they are sometimes cheaper than a regular dentist.

## 2015-10-09 ENCOUNTER — Emergency Department (HOSPITAL_COMMUNITY)
Admission: EM | Admit: 2015-10-09 | Discharge: 2015-10-09 | Disposition: A | Discharge status: Home / Self Care | Payer: Medicaid Other | Attending: Emergency Medicine | Admitting: Emergency Medicine

## 2015-10-09 ENCOUNTER — Encounter (HOSPITAL_COMMUNITY): Payer: Self-pay | Admitting: Emergency Medicine

## 2015-10-09 DIAGNOSIS — Y939 Activity, unspecified: Secondary | ICD-10-CM | POA: Insufficient documentation

## 2015-10-09 DIAGNOSIS — Z791 Long term (current) use of non-steroidal anti-inflammatories (NSAID): Secondary | ICD-10-CM | POA: Diagnosis not present

## 2015-10-09 DIAGNOSIS — S161XXA Strain of muscle, fascia and tendon at neck level, initial encounter: Secondary | ICD-10-CM | POA: Insufficient documentation

## 2015-10-09 DIAGNOSIS — Z79899 Other long term (current) drug therapy: Secondary | ICD-10-CM | POA: Diagnosis not present

## 2015-10-09 DIAGNOSIS — Y929 Unspecified place or not applicable: Secondary | ICD-10-CM | POA: Insufficient documentation

## 2015-10-09 DIAGNOSIS — Z760 Encounter for issue of repeat prescription: Secondary | ICD-10-CM | POA: Diagnosis not present

## 2015-10-09 DIAGNOSIS — Y999 Unspecified external cause status: Secondary | ICD-10-CM | POA: Diagnosis not present

## 2015-10-09 DIAGNOSIS — F17210 Nicotine dependence, cigarettes, uncomplicated: Secondary | ICD-10-CM | POA: Insufficient documentation

## 2015-10-09 DIAGNOSIS — I10 Essential (primary) hypertension: Secondary | ICD-10-CM | POA: Diagnosis not present

## 2015-10-09 DIAGNOSIS — Z7982 Long term (current) use of aspirin: Secondary | ICD-10-CM | POA: Diagnosis not present

## 2015-10-09 DIAGNOSIS — S199XXA Unspecified injury of neck, initial encounter: Secondary | ICD-10-CM | POA: Diagnosis present

## 2015-10-09 DIAGNOSIS — J45909 Unspecified asthma, uncomplicated: Secondary | ICD-10-CM | POA: Diagnosis not present

## 2015-10-09 MED ORDER — LISINOPRIL-HYDROCHLOROTHIAZIDE 10-12.5 MG PO TABS: 1.0000 | ORAL_TABLET | Freq: Every day | ORAL | Status: DC

## 2015-10-09 MED ORDER — NAPROXEN 500 MG PO TABS: 500.0000 mg | ORAL_TABLET | Freq: Two times a day (BID) | ORAL | Status: DC

## 2015-10-09 MED ORDER — CYCLOBENZAPRINE HCL 10 MG PO TABS: 10.0000 mg | ORAL_TABLET | Freq: Three times a day (TID) | ORAL | Status: DC | PRN

## 2015-10-09 NOTE — Discharge Instructions (Signed)
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
## 2015-12-29 ENCOUNTER — Emergency Department (HOSPITAL_COMMUNITY)
Admission: EM | Admit: 2015-12-29 | Discharge: 2015-12-29 | Disposition: A | Discharge status: Home / Self Care | Payer: Medicaid Other | Attending: Emergency Medicine | Admitting: Emergency Medicine

## 2015-12-29 ENCOUNTER — Encounter (HOSPITAL_COMMUNITY): Payer: Self-pay | Admitting: Emergency Medicine

## 2015-12-29 ENCOUNTER — Emergency Department (HOSPITAL_COMMUNITY): Payer: Medicaid Other

## 2015-12-29 DIAGNOSIS — J45909 Unspecified asthma, uncomplicated: Secondary | ICD-10-CM | POA: Diagnosis not present

## 2015-12-29 DIAGNOSIS — F17210 Nicotine dependence, cigarettes, uncomplicated: Secondary | ICD-10-CM | POA: Diagnosis not present

## 2015-12-29 DIAGNOSIS — Z7982 Long term (current) use of aspirin: Secondary | ICD-10-CM | POA: Insufficient documentation

## 2015-12-29 DIAGNOSIS — Y939 Activity, unspecified: Secondary | ICD-10-CM | POA: Insufficient documentation

## 2015-12-29 DIAGNOSIS — I10 Essential (primary) hypertension: Secondary | ICD-10-CM | POA: Insufficient documentation

## 2015-12-29 DIAGNOSIS — W01198A Fall on same level from slipping, tripping and stumbling with subsequent striking against other object, initial encounter: Secondary | ICD-10-CM | POA: Diagnosis not present

## 2015-12-29 DIAGNOSIS — Y999 Unspecified external cause status: Secondary | ICD-10-CM | POA: Insufficient documentation

## 2015-12-29 DIAGNOSIS — Y92096 Garden or yard of other non-institutional residence as the place of occurrence of the external cause: Secondary | ICD-10-CM | POA: Insufficient documentation

## 2015-12-29 DIAGNOSIS — S20212A Contusion of left front wall of thorax, initial encounter: Secondary | ICD-10-CM | POA: Insufficient documentation

## 2015-12-29 DIAGNOSIS — R0781 Pleurodynia: Secondary | ICD-10-CM | POA: Diagnosis present

## 2015-12-29 MED ORDER — HYDROCODONE-ACETAMINOPHEN 5-325 MG PO TABS: ORAL_TABLET | ORAL | 0 refills | Status: DC

## 2015-12-29 MED ORDER — IBUPROFEN 600 MG PO TABS: 600.0000 mg | ORAL_TABLET | Freq: Four times a day (QID) | ORAL | 0 refills | Status: DC | PRN

## 2015-12-29 MED ORDER — LISINOPRIL-HYDROCHLOROTHIAZIDE 10-12.5 MG PO TABS: 1.0000 | ORAL_TABLET | Freq: Every day | ORAL | 0 refills | Status: DC

## 2015-12-29 NOTE — Discharge Instructions (Signed)
Use your spirometer as directed.  Return here for any worsening symptoms

## 2015-12-29 NOTE — ED Provider Notes (Signed)
[facility]-EMER[facility] DEPT Provider Note   CSN: [id number] Arrival date & time: 12/29/15  1145  First Provider Contact:  12/29/15 135 PM    By signing my name below, I, [facility], attest that this documentation has been prepared under the direction and in the presence of [name], PA-C. Electronically Signed: Randell Park, ED Scribe. 12/29/15. 1:53 PM.   History   Chief Complaint Chief Complaint  Park presents with  . Rib Injury    HPI Alan Park is a 40 y.o. male who presents to the Emergency Department complaining of constant, moderate, unchanging left lateral rib pain after an injury that occurred last night. Pt states that he was ambulating in his yard when he tripped over a large rock and fell, striking his left side on the ground and followed immediately by pain over his left-sided ribs. He reports associated pain with deep breathing and movement. Pain is worse with palpation. Denies taking blood thinners. Denies bruising, ecchymosis, abdominal pain or SOB.  The history is provided by the Park. No language interpreter was used.    Past Medical History:  Diagnosis Date  . Asthma   . Hypertension   . Kidney stone   . Kidney stones     There are no active problems to display for this Park.   History reviewed. No pertinent surgical history.     Home Medications    Prior to Admission medications   Medication Sig Start Date End Date Taking? Authorizing Provider  albuterol (PROVENTIL HFA;VENTOLIN HFA) 108 (90 Base) MCG/ACT inhaler Inhale 2 puffs into the lungs every 6 (six) hours as needed for wheezing or shortness of breath.    Historical Provider, MD  Aspirin-Salicylamide-Caffeine (BC HEADACHE POWDER PO) Take 2 packets by mouth.    Historical Provider, MD  cyclobenzaprine (FLEXERIL) 10 MG tablet Take 1 tablet (10 mg total) by mouth 3 (three) times daily as needed. 10/09/15   [name], PA-C  ibuprofen (ADVIL,MOTRIN) 600 MG tablet Take  1 tablet (600 mg total) by mouth every 6 (six) hours as needed. 08/05/15   [name], MD  lisinopril-hydrochlorothiazide (ZESTORETIC) 10-12.5 MG tablet Take 1 tablet by mouth daily. 10/09/15   [name], PA-C  naproxen (NAPROSYN) 500 MG tablet Take 1 tablet (500 mg total) by mouth 2 (two) times daily with a meal. 10/09/15   [name], PA-C    Family History Family History  Problem Relation Age of Onset  . Nephrolithiasis Father     Social History Social History  Substance Use Topics  . Smoking status: Current Every Day Smoker    Packs/day: 1.00    Years: 10.00    Types: Cigarettes  . Smokeless tobacco: Never Used  . Alcohol use Yes     Comment: Occasionally     Allergies   Review of Park's allergies indicates no known allergies.   Review of Systems Review of Systems  Respiratory: Negative for shortness of breath.   Cardiovascular: Positive for chest pain (left lateral chest wall).  Gastrointestinal: Negative for abdominal pain, nausea and vomiting.  Skin: Negative for color change.  Hematological: Does not bruise/bleed easily.     Physical Exam Updated Vital Signs BP (!) 153/105 (BP Location: Left Arm) Comment: pt denies taking bp medication this am.   Pulse 81   Temp 98.1 F (36.7 C) (Oral)   Resp 18   Ht 5\' 8"  (1.727 m)   Wt 150 lb (68 kg)   SpO2 100%   BMI 22.81 kg/m  Physical Exam  Constitutional: He is oriented to person, place, and time. He appears well-developed and well-nourished. No distress.  HENT:  Head: Normocephalic and atraumatic.  Eyes: Conjunctivae are normal.  Neck: Normal range of motion.  Cardiovascular: Normal rate.   Pulmonary/Chest: Effort normal. No respiratory distress.  Focal tenderness of the left lateral chest wall. Splinting to the area on exam. No crepitus. Lungs CTA normally.  Abdominal: Soft. He exhibits no distension. There is no tenderness.  Musculoskeletal: Normal range of motion.  Neurological: He is alert and  oriented to person, place, and time.  Skin: Skin is warm and dry.  Psychiatric: He has a normal mood and affect. His behavior is normal.  Nursing note and vitals reviewed.    ED Treatments / Results  DIAGNOSTIC STUDIES: Oxygen Saturation is 100% on RA, normal by my interpretation.    COORDINATION OF CARE: 1:38 PM Advised at home symptomatic treatment with incentive spirometer. Discussed results of chest imaging. Will discharge pt.  Discussed treatment plan with pt at bedside and pt agreed to plan.  1:51 PM On re-check of vitals, pt was still hypertensive. Returned to speak with pt who stated that he has been out of his HTN medication for a month. Refilled prescription of HTN medications.  Radiology Dg Ribs Unilateral W/chest Left  Result Date: 12/29/2015 CLINICAL DATA:  Park status post fall.  Left anterior rib pain. EXAM: LEFT RIBS AND CHEST - 3+ VIEW COMPARISON:  Chest radiograph 07/15/2015 FINDINGS: Normal cardiac mediastinal contours. No consolidative pulmonary opacities. No pleural effusion or pneumothorax. No displaced left-sided rib fracture. IMPRESSION: No acute cardiopulmonary process.  Displaced No displaced Left-sided rib fracture. Electronically Signed   By: Annia Belt M.D.   On: 12/29/2015 12:21   Procedures Procedures     Initial Impression / Assessment and Plan / ED Course  I have reviewed the triage vital signs and the nursing notes.  Pertinent labs & imaging results that were available during my care of the Park were reviewed by me and considered in my medical decision making (see chart for details).  Clinical Course    Pt well appearing.  Vitals stable.  CXR neg.  Likely contusion.  Pt has incentive spirometer at home.  Instructed on proper use.  Return precautions given.  Final Clinical Impressions(s) / ED Diagnoses   Final diagnoses:  Rib contusion, left, initial encounter    New Prescriptions Discharge Medication List as of 12/29/2015  1:45 PM     START taking these medications   Details  HYDROcodone-acetaminophen (NORCO/VICODIN) 5-325 MG tablet Take one-two tabs po q 4-6 hrs prn pain, Print       I personally performed the services described in this documentation, which was scribed in my presence. The recorded information has been reviewed and is accurate.  Rosey Bath 12/30/15 2156    Bethann Berkshire, MD 12/31/15 Rickey Primus

## 2015-12-29 NOTE — ED Notes (Signed)
Pt verbalized understanding of no driving and to use caution within 4 hours of taking pain meds due to meds cause drowsiness 

## 2015-12-29 NOTE — ED Notes (Signed)
Took ibuprofen 800mg  this morning

## 2015-12-29 NOTE — ED Triage Notes (Signed)
Pt reports tripped over a rock last night and landed on left side of abdomen. Pt reports left rib cage pain. Chest expansion symmetrical. No contusion noted in triage. Pt denies being on any blood thinners.

## 2016-02-22 ENCOUNTER — Emergency Department (HOSPITAL_COMMUNITY)
Admission: EM | Admit: 2016-02-22 | Discharge: 2016-02-22 | Disposition: A | Discharge status: Home / Self Care | Payer: Medicaid Other | Attending: Emergency Medicine | Admitting: Emergency Medicine

## 2016-02-22 ENCOUNTER — Encounter (HOSPITAL_COMMUNITY): Payer: Self-pay | Admitting: Emergency Medicine

## 2016-02-22 DIAGNOSIS — M545 Low back pain, unspecified: Secondary | ICD-10-CM

## 2016-02-22 DIAGNOSIS — F17210 Nicotine dependence, cigarettes, uncomplicated: Secondary | ICD-10-CM | POA: Diagnosis not present

## 2016-02-22 DIAGNOSIS — Z7982 Long term (current) use of aspirin: Secondary | ICD-10-CM | POA: Diagnosis not present

## 2016-02-22 DIAGNOSIS — J45909 Unspecified asthma, uncomplicated: Secondary | ICD-10-CM | POA: Insufficient documentation

## 2016-02-22 DIAGNOSIS — Z79899 Other long term (current) drug therapy: Secondary | ICD-10-CM | POA: Insufficient documentation

## 2016-02-22 DIAGNOSIS — I10 Essential (primary) hypertension: Secondary | ICD-10-CM | POA: Insufficient documentation

## 2016-02-22 MED ORDER — CYCLOBENZAPRINE HCL 5 MG PO TABS: 5.0000 mg | ORAL_TABLET | Freq: Two times a day (BID) | ORAL | 0 refills | Status: DC | PRN

## 2016-02-22 MED ORDER — NAPROXEN 500 MG PO TABS: 500.0000 mg | ORAL_TABLET | Freq: Two times a day (BID) | ORAL | 0 refills | Status: DC

## 2016-02-22 NOTE — ED Triage Notes (Signed)
Patient c/o lower back pain with no known injury. Denies any problems with urination or BM. CNS intact. Per patient worse with movement. Denies taking any medication for pain.

## 2016-02-22 NOTE — ED Provider Notes (Signed)
[facility] Provider Note   CSN: [id number] Arrival date & time: 02/22/16  1536  By signing my name below, I, Soijett Blue, attest that this documentation has been prepared under the direction and in the presence of Arthor Captain, PA-C Electronically Signed: Soijett Blue, ED Scribe. 02/22/16. 4:49 PM.   History   Chief Complaint Chief Complaint  Patient presents with  . Back Pain    HPI  Alan Park is a 40 y.o. male with a PMHx of HTN, who presents to the Emergency Department complaining of lower back pain onset yesterday evening. 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 Pt states that his lower back pain is worsened with movement and alleviated with rest. He reports that the back pain does intermittently radiate to his right thigh. Pt notes that he has had back pain like this in the past.  He states that he has not tried any medications for the relief of his symptoms. Pt denies difficulty urinating, bowel/bladder incontinence, numbness, tingling, fever, and any other symptoms. Denies CA, IV drug use, or recent back procedures. Denies allergies to medications.    The history is provided by the patient. No language interpreter was used.    Past Medical History:  Diagnosis Date  . Asthma   . Hypertension   . Kidney stone   . Kidney stones     There are no active problems to display for this patient.   History reviewed. No pertinent surgical history.     Home Medications    Prior to Admission medications   Medication Sig Start Date End Date Taking? Authorizing Provider  albuterol (PROVENTIL HFA;VENTOLIN HFA) 108 (90 Base) MCG/ACT inhaler Inhale 2 puffs into the lungs every 6 (six) hours as needed for wheezing or shortness of breath.    Historical Provider, MD  Aspirin-Salicylamide-Caffeine (BC HEADACHE POWDER PO) Take 2 packets by mouth.    Historical Provider, MD  cyclobenzaprine (FLEXERIL) 10 MG  tablet Take 1 tablet (10 mg total) by mouth 3 (three) times daily as needed. 10/09/15   Tammy Triplett, PA-C  HYDROcodone-acetaminophen (NORCO/VICODIN) 5-325 MG tablet Take one-two tabs po q 4-6 hrs prn pain 12/29/15   Tammy Triplett, PA-C  ibuprofen (ADVIL,MOTRIN) 600 MG tablet Take 1 tablet (600 mg total) by mouth every 6 (six) hours as needed. 12/29/15   Tammy Triplett, PA-C  lisinopril-hydrochlorothiazide (PRINZIDE,ZESTORETIC) 10-12.5 MG tablet Take 1 tablet by mouth daily. 12/29/15   Tammy Triplett, PA-C  naproxen (NAPROSYN) 500 MG tablet Take 1 tablet (500 mg total) by mouth 2 (two) times daily with a meal. 10/09/15   Tammy Triplett, PA-C    Family History Family History  Problem Relation Age of Onset  . Nephrolithiasis Father     Social History Social History  Substance Use Topics  . Smoking status: Current Every Day Smoker    Packs/day: 1.00    Years: 10.00    Types: Cigarettes  . Smokeless tobacco: Never Used  . Alcohol use Yes     Comment: Occasionally     Allergies   Review of patient's allergies indicates no known allergies.   Review of Systems Review of Systems  Constitutional: Negative for chills and fever.  Gastrointestinal:       No bowel incontinence.   Genitourinary: Negative for difficulty urinating.       No bladder incontinence.  Musculoskeletal: Positive for back pain (lower).  Neurological: Negative for numbness.  No tingling     Physical Exam Updated Vital Signs BP (!) 139/109 (BP Location: Left Arm)   Pulse 84   Temp 98.1 F (36.7 C) (Oral)   Resp 18   Ht 5\' 8"  (1.727 m)   Wt 150 lb (68 kg)   SpO2 100%   BMI 22.81 kg/m   Physical Exam  Constitutional: He is oriented to person, place, and time. He appears well-developed and well-nourished. No distress.  HENT:  Head: Normocephalic and atraumatic.  Eyes: EOM are normal.  Neck: Neck supple.  Cardiovascular: Normal rate.   Pulmonary/Chest: Effort normal. No respiratory distress.    Abdominal: He exhibits no distension.  Musculoskeletal: Normal range of motion.  No significant midline spinal tenderness, crepitus, or step-offs. Tender on the right lumbar paraspinal. Tenderness over sacrum and SI joints. Nl strength FROM.   Neurological: He is alert and oriented to person, place, and time. He has normal strength.  Nl patellar reflexes.   Skin: Skin is warm and dry.  Psychiatric: He has a normal mood and affect. His behavior is normal.  Nursing note and vitals reviewed.    ED Treatments / Results  DIAGNOSTIC STUDIES: Oxygen Saturation is 100% on RA, nl by my interpretation.    COORDINATION OF CARE: 4:46 PM Discussed treatment plan with pt at bedside which includes naprosyn and flexeril and pt agreed to plan.  Procedures Procedures (including critical care time)  Medications Ordered in ED Medications - No data to display   Initial Impression / Assessment and Plan / ED Course  I have reviewed the triage vital signs and the nursing notes.   Clinical Course      Patient with back pain.  No neurological deficits and normal neuro exam.  Patient is ambulatory.  No loss of bowel or bladder control.  No concern for cauda equina.  No fever, night sweats, weight loss, h/o cancer, IVDA, no recent procedure to back. No urinary symptoms suggestive of UTI.  Pt discharged home with naprosyn Rx, flexeril Rx, and advised use of lidocaine patches PRN. Supportive care and return precaution discussed. Appears safe for discharge at this time. Follow up as indicated in discharge paperwork.   Final Clinical Impressions(s) / ED Diagnoses   Final diagnoses:  None    New Prescriptions New Prescriptions   No medications on file   I personally performed the services described in this documentation, which was scribed in my presence. The recorded information has been reviewed and is accurate.        Arthor Captainbigail Arris Meyn, PA-C 02/22/16 1653    Geoffery Lyonsouglas Delo, MD 02/22/16 [phone]

## 2016-02-22 NOTE — Discharge Instructions (Signed)
I would recommend purchasing over-the-counter lidocaine 4% patches for your low back. They're available. At most stores.  SEEK IMMEDIATE MEDICAL ATTENTION IF: New numbness, tingling, weakness, or problem with the use of your arms or legs.  Severe back pain not relieved with medications.  Change in bowel or bladder control.  Increasing pain in any areas of the body (such as chest or abdominal pain).  Shortness of breath, dizziness or fainting.  Nausea (feeling sick to your stomach), vomiting, fever, or sweats.

## 2016-02-27 ENCOUNTER — Encounter (HOSPITAL_COMMUNITY): Payer: Self-pay | Admitting: *Deleted

## 2016-02-27 ENCOUNTER — Emergency Department (HOSPITAL_COMMUNITY)
Admission: EM | Admit: 2016-02-27 | Discharge: 2016-02-27 | Disposition: A | Discharge status: Home / Self Care | Payer: Medicaid Other | Attending: Emergency Medicine | Admitting: Emergency Medicine

## 2016-02-27 ENCOUNTER — Emergency Department (HOSPITAL_COMMUNITY): Payer: Medicaid Other

## 2016-02-27 DIAGNOSIS — M5441 Lumbago with sciatica, right side: Secondary | ICD-10-CM | POA: Insufficient documentation

## 2016-02-27 DIAGNOSIS — J45909 Unspecified asthma, uncomplicated: Secondary | ICD-10-CM | POA: Insufficient documentation

## 2016-02-27 DIAGNOSIS — I10 Essential (primary) hypertension: Secondary | ICD-10-CM | POA: Insufficient documentation

## 2016-02-27 DIAGNOSIS — Z7982 Long term (current) use of aspirin: Secondary | ICD-10-CM | POA: Diagnosis not present

## 2016-02-27 DIAGNOSIS — Z79899 Other long term (current) drug therapy: Secondary | ICD-10-CM | POA: Diagnosis not present

## 2016-02-27 DIAGNOSIS — M545 Low back pain: Secondary | ICD-10-CM | POA: Diagnosis present

## 2016-02-27 DIAGNOSIS — F17210 Nicotine dependence, cigarettes, uncomplicated: Secondary | ICD-10-CM | POA: Insufficient documentation

## 2016-02-27 MED ORDER — METHOCARBAMOL 500 MG PO TABS: 500.0000 mg | ORAL_TABLET | Freq: Two times a day (BID) | ORAL | 0 refills | Status: DC

## 2016-02-27 MED ORDER — HYDROCODONE-ACETAMINOPHEN 5-325 MG PO TABS
1.0000 | ORAL_TABLET | Freq: Once | ORAL | Status: AC
  Administered 2016-02-27: 1 via ORAL
  Filled 2016-02-27: qty 1

## 2016-02-27 MED ORDER — PREDNISONE 20 MG PO TABS: 40.0000 mg | ORAL_TABLET | Freq: Every day | ORAL | 0 refills | Status: DC

## 2016-02-27 NOTE — ED Provider Notes (Signed)
[facility] Provider Note   CSN: [id number] Arrival date & time: 02/27/16  2059     History   Chief Complaint Chief Complaint  Patient presents with  . Back Pain    HPI Alan Park is a 40 y.o. male.  Alan Park is a 40 y.o. Male who presents to the emergency department complaining of continued low back pain for the past week. Patient reports 1 week ago he was lifting up a log when he felt like he pulled his back. He reports he has pain to his middle of his low back that sometimes radiates down his posterior right leg. He reports his pain is worse with movement. He was seen in the emergency department 5 days ago and was discharged with naproxen and Flexeril. He reports these medicines have not been helping. He is here because he has had continued pain. No new back injury. Patient denies fevers, numbness, tingling, weakness, loss of bladder control, loss of bowel control, history of IV drug use, history of cancer, urinary symptoms, rashes, abdominal pain, nausea or vomiting.   The history is provided by the patient and medical records. No language interpreter was used.  Back Pain   Pertinent negatives include no chest pain, no fever, no numbness, no abdominal pain, no dysuria and no weakness.    Past Medical History:  Diagnosis Date  . Asthma   . Hypertension   . Kidney stone   . Kidney stones     There are no active problems to display for this patient.   History reviewed. No pertinent surgical history.     Home Medications    Prior to Admission medications   Medication Sig Start Date End Date Taking? Authorizing Provider  albuterol (PROVENTIL HFA;VENTOLIN HFA) 108 (90 Base) MCG/ACT inhaler Inhale 2 puffs into the lungs every 6 (six) hours as needed for wheezing or shortness of breath.    Historical Provider, MD  Aspirin-Salicylamide-Caffeine (BC HEADACHE POWDER PO) Take 2 packets by mouth.    Historical Provider, MD  lisinopril-hydrochlorothiazide  (PRINZIDE,ZESTORETIC) 10-12.5 MG tablet Take 1 tablet by mouth daily. 12/29/15   Tammy Triplett, PA-C  methocarbamol (ROBAXIN) 500 MG tablet Take 1 tablet (500 mg total) by mouth 2 (two) times daily. 02/27/16   Everlene Farrier, PA-C  naproxen (NAPROSYN) 500 MG tablet Take 1 tablet (500 mg total) by mouth 2 (two) times daily with a meal. 02/22/16   Arthor Captain, PA-C  predniSONE (DELTASONE) 20 MG tablet Take 2 tablets (40 mg total) by mouth daily. 02/27/16   Everlene Farrier, PA-C    Family History Family History  Problem Relation Age of Onset  . Nephrolithiasis Father     Social History Social History  Substance Use Topics  . Smoking status: Current Every Day Smoker    Packs/day: 1.00    Years: 10.00    Types: Cigarettes  . Smokeless tobacco: Never Used  . Alcohol use Yes     Comment: Occasionally     Allergies   Review of patient's allergies indicates no known allergies.   Review of Systems Review of Systems  Constitutional: Negative for fever.  Cardiovascular: Negative for chest pain.  Gastrointestinal: Negative for abdominal pain, diarrhea and nausea.  Genitourinary: Negative for difficulty urinating, dysuria, flank pain, frequency, hematuria and urgency.  Musculoskeletal: Positive for back pain. Negative for neck pain.  Skin: Negative for rash and wound.  Neurological: Negative for weakness and numbness.     Physical Exam Updated Vital Signs BP 133/91 (  BP Location: Left Arm)   Pulse 92   Temp 98.1 F (36.7 C) (Oral)   Resp 16   Ht 5\' 8"  (1.727 m)   Wt 68 kg   SpO2 100%   BMI 22.81 kg/m   Physical Exam  Constitutional: He appears well-developed and well-nourished. No distress.  Nontoxic appearing.  HENT:  Head: Normocephalic and atraumatic.  Eyes: Conjunctivae are normal. Pupils are equal, round, and reactive to light. Right eye exhibits no discharge. Left eye exhibits no discharge.  Neck: Neck supple.  Cardiovascular: Normal rate, regular rhythm, normal  heart sounds and intact distal pulses.   Pulmonary/Chest: Effort normal and breath sounds normal. No respiratory distress.  Abdominal: Soft. He exhibits no distension. There is no tenderness. There is no guarding.  Musculoskeletal: Normal range of motion. He exhibits tenderness. He exhibits no edema or deformity.  Patient has tenderness along his bilateral lumbar spine in midline of his spine. No crepitus. No deformity. No back erythema, edema or warmth. Good strength to his bilateral lower extremities. Normal gait.  Lymphadenopathy:    He has no cervical adenopathy.  Neurological: He is alert. He has normal reflexes. He displays normal reflexes. Coordination normal.  Bilateral patellar DTRs are intact. Normal gait.  Skin: Skin is warm and dry. Capillary refill takes less than 2 seconds. No rash noted. He is not diaphoretic. No erythema. No pallor.  Psychiatric: He has a normal mood and affect. His behavior is normal.  Nursing note and vitals reviewed.    ED Treatments / Results  Labs (all labs ordered are listed, but only abnormal results are displayed) Labs Reviewed - No data to display  EKG  EKG Interpretation None       Radiology Dg Lumbar Spine Complete  Result Date: 02/27/2016 CLINICAL DATA:  Acute onset of lower back pain, radiating down the right leg. Initial encounter. EXAM: LUMBAR SPINE - COMPLETE 4+ VIEW COMPARISON:  CT of the abdomen and pelvis from 11/14/2014 FINDINGS: There is no evidence of fracture or subluxation. Vertebral bodies demonstrate normal height and alignment. Intervertebral disc spaces are preserved. The visualized neural foramina are grossly unremarkable in appearance. A 4 mm stone is noted at the upper pole of the right kidney. The visualized bowel gas pattern is unremarkable in appearance; air and stool are noted within the colon. The sacroiliac joints are within normal limits. IMPRESSION: 1. No evidence of fracture or subluxation along the lumbar spine.  2. 4 mm stone at the upper pole of the right kidney. Electronically Signed   By: Roanna Raider M.D.   On: 02/27/2016 23:03    Procedures Procedures (including critical care time)  Medications Ordered in ED Medications  HYDROcodone-acetaminophen (NORCO/VICODIN) 5-325 MG per tablet 1 tablet (1 tablet Oral Given 02/27/16 2200)     Initial Impression / Assessment and Plan / ED Course  I have reviewed the triage vital signs and the nursing notes.  Pertinent labs & imaging results that were available during my care of the patient were reviewed by me and considered in my medical decision making (see chart for details).  Clinical Course   This is a 40 y.o. Male who presents to the emergency department complaining of continued low back pain for the past week. Patient reports 1 week ago he was lifting up a log when he felt like he pulled his back. He reports he has pain to his middle of his low back that sometimes radiates down his posterior right leg. He reports his pain  is worse with movement. He was seen in the emergency department 5 days ago and was discharged with naproxen and Flexeril. He reports these medicines have not been helping. He is here because he has had continued pain. No new back injury.  On exam the patient is afebrile nontoxic appearing.  No neurological deficits and normal neuro exam.  Normal gait.  No loss of bowel or bladder control.  No concern for cauda equina.  No fever, night sweats, weight loss, h/o cancer, IVDU.  I obtained a lumbar spine x-ray as the patient has a second visit for the same pain. Lumbar spine x-ray showed no evidence of fracture or subluxation along the lumbar spine. It is indicated 4 mm and at the upper pole of the right kidney. Patient is not having right flank pain and reports that his pain does not feel like a kidney stone. I encouraged him to continue using naproxen twice a day. Will discontinue the Flexeril and try Robaxin. Will also do a short course of  prednisone as he seems to be having some radicular pain with some pain that radiates down his posterior right leg. I also discussed using back exercises and ice. I encouraged him to follow-up with his primary care provider. I discussed return precautions. I advised the patient to follow-up with their primary care provider this week. I advised the patient to return to the emergency department with new or worsening symptoms or new concerns. The patient verbalized understanding and agreement with plan.      Final Clinical Impressions(s) / ED Diagnoses   Final diagnoses:  Bilateral low back pain with right-sided sciatica    New Prescriptions Discharge Medication List as of 02/27/2016 11:20 PM    START taking these medications   Details  methocarbamol (ROBAXIN) 500 MG tablet Take 1 tablet (500 mg total) by mouth 2 (two) times daily., Starting Fri 02/27/2016, Print    predniSONE (DELTASONE) 20 MG tablet Take 2 tablets (40 mg total) by mouth daily., Starting Fri 02/27/2016, Print         Everlene FarrierWilliam Keonta Monceaux, PA-C 02/28/16 [id number]    Bethann BerkshireJoseph Zammit, MD 03/01/16 2003

## 2016-02-27 NOTE — ED Triage Notes (Signed)
Ambulates heel to toe without stagger or drift.  Denies urgency or incontinence

## 2016-02-27 NOTE — ED Triage Notes (Signed)
Pt c/o continued lower back pain. Pt denies no known injury and any other symptoms. Pt was seen here on the 17th for the same but has not followed up with Dr. Romeo AppleHarrison.

## 2016-03-23 ENCOUNTER — Encounter (HOSPITAL_COMMUNITY): Payer: Self-pay | Admitting: Emergency Medicine

## 2016-03-23 ENCOUNTER — Emergency Department (HOSPITAL_COMMUNITY)
Admission: EM | Admit: 2016-03-23 | Discharge: 2016-03-23 | Disposition: A | Discharge status: Home / Self Care | Payer: Medicaid Other | Attending: Emergency Medicine | Admitting: Emergency Medicine

## 2016-03-23 DIAGNOSIS — F17210 Nicotine dependence, cigarettes, uncomplicated: Secondary | ICD-10-CM | POA: Diagnosis not present

## 2016-03-23 DIAGNOSIS — Z79899 Other long term (current) drug therapy: Secondary | ICD-10-CM | POA: Diagnosis not present

## 2016-03-23 DIAGNOSIS — Z7982 Long term (current) use of aspirin: Secondary | ICD-10-CM | POA: Insufficient documentation

## 2016-03-23 DIAGNOSIS — J45909 Unspecified asthma, uncomplicated: Secondary | ICD-10-CM | POA: Insufficient documentation

## 2016-03-23 DIAGNOSIS — M5441 Lumbago with sciatica, right side: Secondary | ICD-10-CM | POA: Diagnosis not present

## 2016-03-23 DIAGNOSIS — M5431 Sciatica, right side: Secondary | ICD-10-CM

## 2016-03-23 DIAGNOSIS — M545 Low back pain: Secondary | ICD-10-CM | POA: Diagnosis present

## 2016-03-23 DIAGNOSIS — I10 Essential (primary) hypertension: Secondary | ICD-10-CM | POA: Insufficient documentation

## 2016-03-23 MED ORDER — HYDROCODONE-ACETAMINOPHEN 5-325 MG PO TABS
1.0000 | ORAL_TABLET | Freq: Once | ORAL | Status: AC
  Administered 2016-03-23: 1 via ORAL
  Filled 2016-03-23: qty 1

## 2016-03-23 MED ORDER — METHOCARBAMOL 500 MG PO TABS: 500.0000 mg | ORAL_TABLET | Freq: Three times a day (TID) | ORAL | 0 refills | Status: DC

## 2016-03-23 MED ORDER — NAPROXEN 500 MG PO TABS: 500.0000 mg | ORAL_TABLET | Freq: Two times a day (BID) | ORAL | 0 refills | Status: DC

## 2016-03-23 MED ORDER — PREDNISONE 10 MG PO TABS: ORAL_TABLET | ORAL | 0 refills | Status: DC

## 2016-03-23 NOTE — Discharge Instructions (Signed)
Follow-up with one of the providers listed if not improving

## 2016-03-23 NOTE — ED Triage Notes (Signed)
Pt reports low back pain with radiation down his R leg.

## 2016-03-27 NOTE — ED Provider Notes (Signed)
[facility] Provider Note   CSN: [id number] Arrival date & time: 03/23/16  1604     History   Chief Complaint Chief Complaint  Patient presents with  . Back Pain    HPI Alan Park is a 40 y.o. male.  HPI  Alan Park is a 40 y.o. male who presents to the Emergency Department complaining of low back pain for nearly one month.  He was lifting a log when pain began and pain radiates into right leg.  He was seen here last month for same and states he has not been able to afford to see a specialist. He denies numbness, weakness of the LE's, abd pain, urinary or bowel changes or fever.   Past Medical History:  Diagnosis Date  . Asthma   . Hypertension   . Kidney stone   . Kidney stones     There are no active problems to display for this patient.   History reviewed. No pertinent surgical history.     Home Medications    Prior to Admission medications   Medication Sig Start Date End Date Taking? Authorizing Provider  albuterol (PROVENTIL HFA;VENTOLIN HFA) 108 (90 Base) MCG/ACT inhaler Inhale 2 puffs into the lungs every 6 (six) hours as needed for wheezing or shortness of breath.    Historical Provider, MD  Aspirin-Salicylamide-Caffeine (BC HEADACHE POWDER PO) Take 2 packets by mouth.    Historical Provider, MD  lisinopril-hydrochlorothiazide (PRINZIDE,ZESTORETIC) 10-12.5 MG tablet Take 1 tablet by mouth daily. 12/29/15   Rubyann Lingle, PA-C  methocarbamol (ROBAXIN) 500 MG tablet Take 1 tablet (500 mg total) by mouth 3 (three) times daily. 03/23/16   Orchid Glassberg, PA-C  naproxen (NAPROSYN) 500 MG tablet Take 1 tablet (500 mg total) by mouth 2 (two) times daily with a meal. 03/23/16   Diar Berkel, PA-C  predniSONE (DELTASONE) 10 MG tablet Take 6 tablets day one, 5 tablets day two, 4 tablets day three, 3 tablets day four, 2 tablets day five, then 1 tablet day six 03/23/16   Pauline Ausammy Cortez Steelman, PA-C    Family History Family History  Problem Relation  Age of Onset  . Nephrolithiasis Father     Social History Social History  Substance Use Topics  . Smoking status: Current Every Day Smoker    Packs/day: 1.00    Years: 10.00    Types: Cigarettes  . Smokeless tobacco: Never Used  . Alcohol use Yes     Comment: Occasionally     Allergies   Review of patient's allergies indicates no known allergies.   Review of Systems Review of Systems  Constitutional: Negative for fever.  Respiratory: Negative for shortness of breath.   Gastrointestinal: Negative for abdominal pain, constipation and vomiting.  Genitourinary: Negative for decreased urine volume, difficulty urinating, dysuria, flank pain and hematuria.  Musculoskeletal: Positive for back pain. Negative for joint swelling.  Skin: Negative for rash.  Neurological: Negative for weakness and numbness.  All other systems reviewed and are negative.    Physical Exam Updated Vital Signs BP 127/90 (BP Location: Left Arm)   Pulse 81   Temp 98.4 F (36.9 C) (Oral)   Resp 18   Ht 5\' 8"  (1.727 m)   Wt 68 kg   SpO2 100%   BMI 22.81 kg/m   Physical Exam  Constitutional: He is oriented to person, place, and time. He appears well-developed and well-nourished. No distress.  HENT:  Head: Normocephalic and atraumatic.  Neck: Normal range of motion. Neck supple.  Cardiovascular: Normal rate, regular rhythm and intact distal pulses.   No murmur heard. Pulmonary/Chest: Effort normal and breath sounds normal. No respiratory distress.  Abdominal: Soft. He exhibits no distension. There is no tenderness.  Musculoskeletal: He exhibits tenderness. He exhibits no edema.       Lumbar back: He exhibits tenderness and pain. He exhibits normal range of motion, no swelling, no deformity, no laceration and normal pulse.  ttp of the lower lumbar spine and right paraspinal muscles. DP pulses are brisk and symmetrical.  Distal sensation intact.  Pt has 5/5 strength against resistance of bilateral  lower extremities.     Neurological: He is alert and oriented to person, place, and time. He has normal strength. No sensory deficit. He exhibits normal muscle tone. Coordination and gait normal.  Reflex Scores:      Patellar reflexes are 2+ on the right side and 2+ on the left side.      Achilles reflexes are 2+ on the right side and 2+ on the left side. Skin: Skin is warm and dry. No rash noted.  Nursing note and vitals reviewed.    ED Treatments / Results  Labs (all labs ordered are listed, but only abnormal results are displayed) Labs Reviewed - No data to display  EKG  EKG Interpretation None       Radiology No results found.  Procedures Procedures (including critical care time)  Medications Ordered in ED Medications  HYDROcodone-acetaminophen (NORCO/VICODIN) 5-325 MG per tablet 1 tablet (1 tablet Oral Given 03/23/16 1725)     Initial Impression / Assessment and Plan / ED Course  I have reviewed the triage vital signs and the nursing notes.  Pertinent labs & imaging results that were available during my care of the patient were reviewed by me and considered in my medical decision making (see chart for details).  Clinical Course    Pt is well appearing.  No focal neuro deficits, no emergent neuro deficits.  Ambulates in the dept with steady gait. Discussed possible disc involvement and need for ortho or neurosurgical f/u  Final Clinical Impressions(s) / ED Diagnoses   Final diagnoses:  Sciatica of right side    New Prescriptions Discharge Medication List as of 03/23/2016  5:23 PM       Katrine Radich Trisha Mangle, PA-C 03/27/16 1525    Donnetta Hutching, MD 03/30/16 1330

## 2016-05-10 IMAGING — DX DG SHOULDER 2+V*L*
3 series · 3 of 3 positions shown · non-contrast
Comparison: 04/06/2015 left shoulder radiographs

CLINICAL DATA: Left shoulder pain after recent injury.

EXAM:
LEFT SHOULDER - 2+ VIEW

[shoulder grashey]
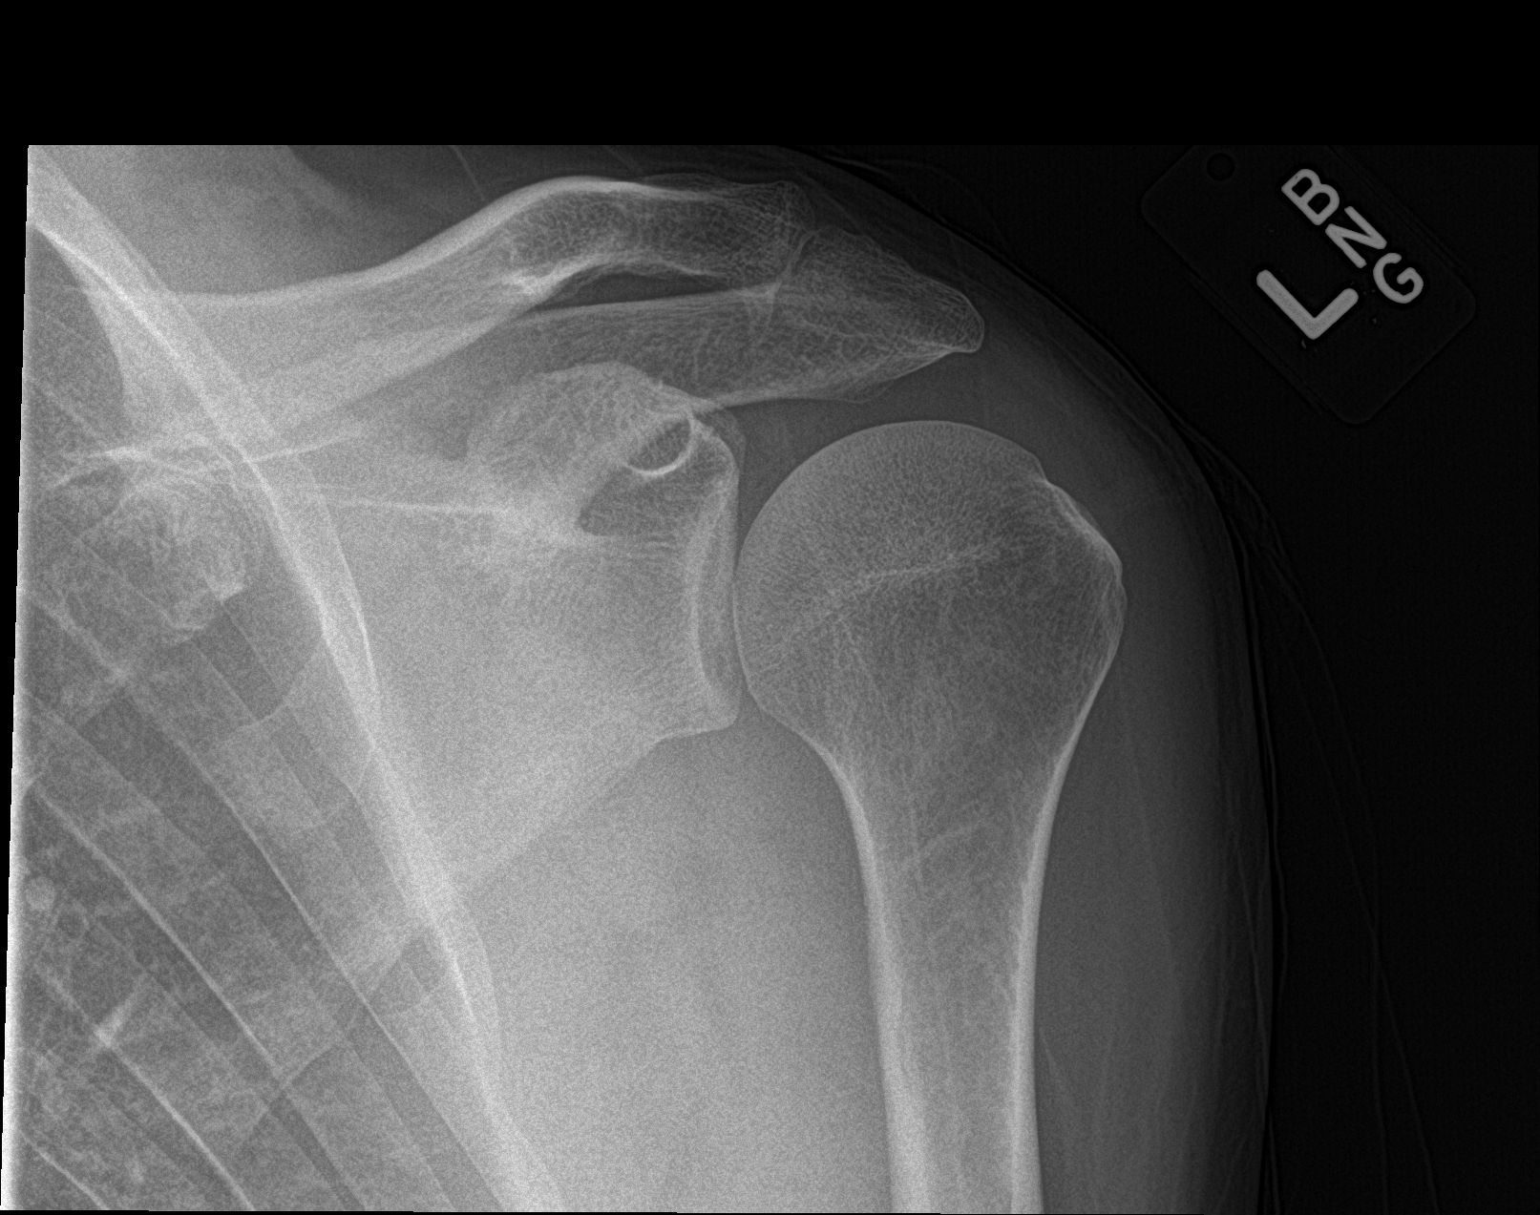

[shoulder y view]
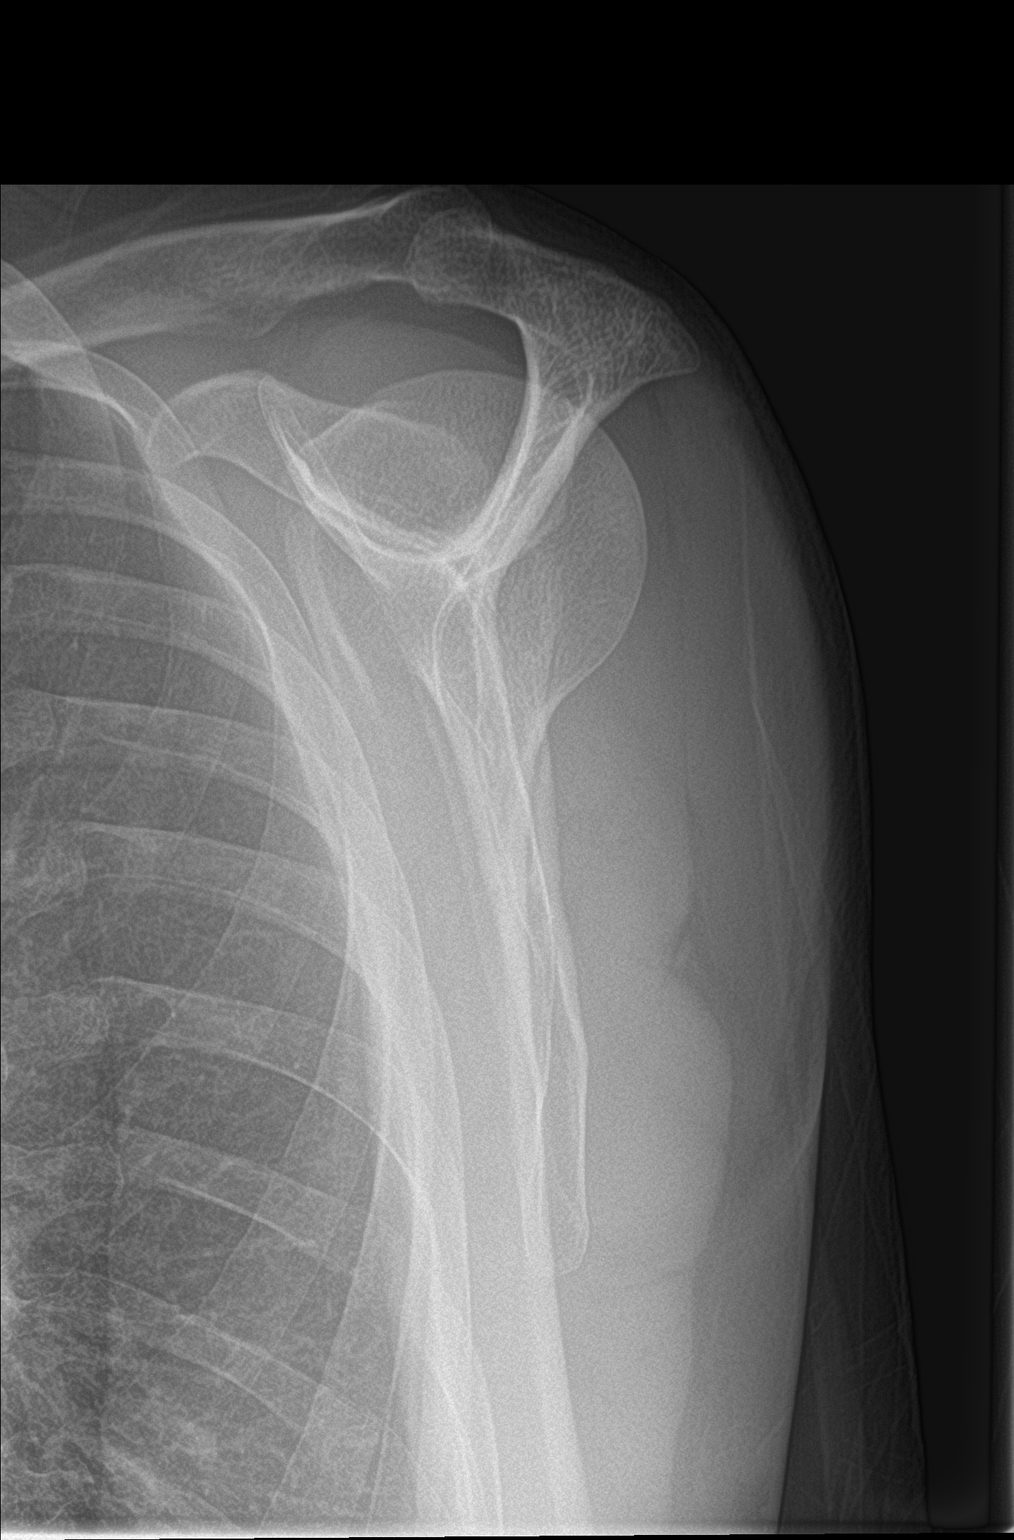

[shoulder axillary]
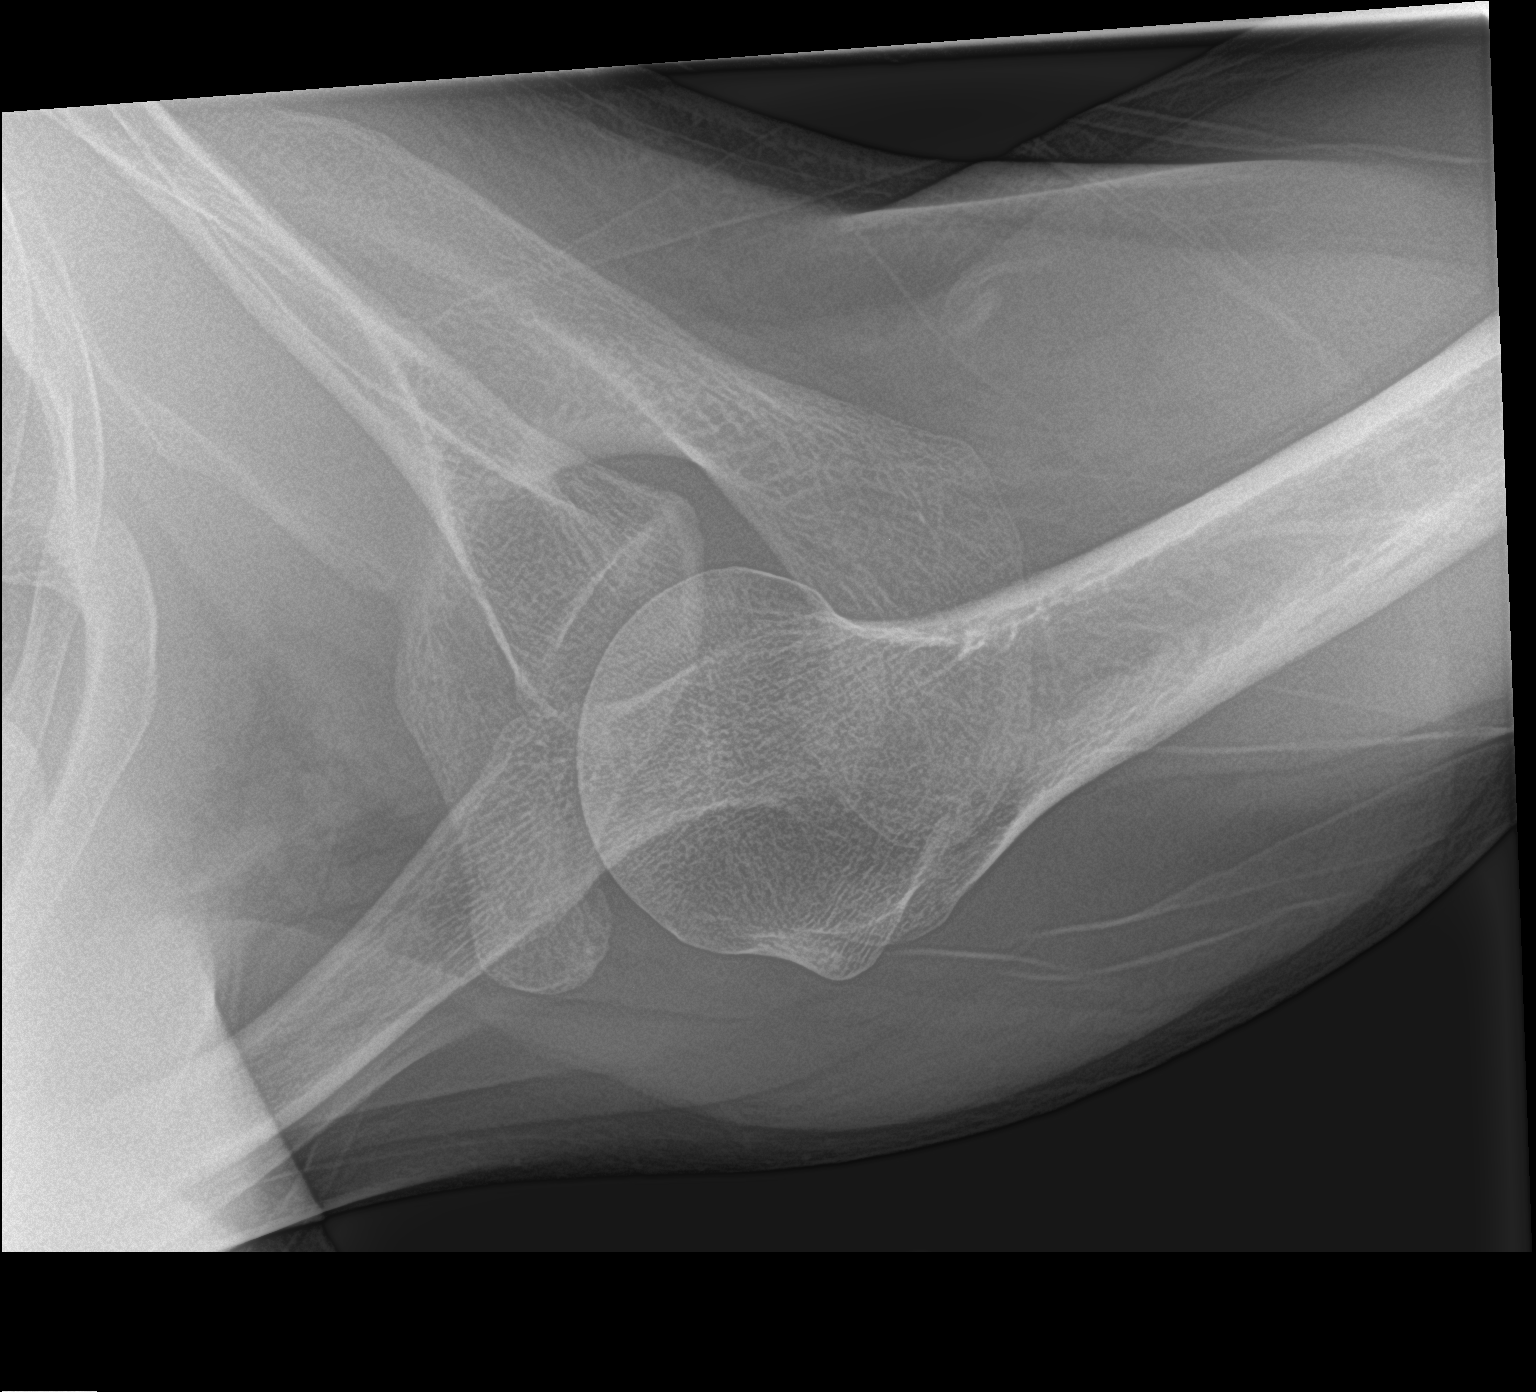

[3 of 3 positions shown; findings below may reference images not displayed]

FINDINGS: There is no evidence of fracture or dislocation. There is no
evidence of arthropathy or other focal bone abnormality. Soft
tissues are unremarkable.
IMPRESSION: Negative.

## 2016-07-22 ENCOUNTER — Emergency Department (HOSPITAL_COMMUNITY)
Admission: EM | Admit: 2016-07-22 | Discharge: 2016-07-22 | Disposition: A | Discharge status: Home / Self Care | Payer: Medicaid Other | Attending: Emergency Medicine | Admitting: Emergency Medicine

## 2016-07-22 ENCOUNTER — Encounter (HOSPITAL_COMMUNITY): Payer: Self-pay

## 2016-07-22 DIAGNOSIS — J45909 Unspecified asthma, uncomplicated: Secondary | ICD-10-CM | POA: Insufficient documentation

## 2016-07-22 DIAGNOSIS — F17210 Nicotine dependence, cigarettes, uncomplicated: Secondary | ICD-10-CM | POA: Insufficient documentation

## 2016-07-22 DIAGNOSIS — M5441 Lumbago with sciatica, right side: Secondary | ICD-10-CM | POA: Insufficient documentation

## 2016-07-22 DIAGNOSIS — I10 Essential (primary) hypertension: Secondary | ICD-10-CM | POA: Insufficient documentation

## 2016-07-22 HISTORY — DX: Other chronic pain: G89.29

## 2016-07-22 HISTORY — DX: Dorsalgia, unspecified: M54.9

## 2016-07-22 MED ORDER — LISINOPRIL-HYDROCHLOROTHIAZIDE 10-12.5 MG PO TABS: 1.0000 | ORAL_TABLET | Freq: Every day | ORAL | 0 refills | Status: DC

## 2016-07-22 MED ORDER — CYCLOBENZAPRINE HCL 10 MG PO TABS
10.0000 mg | ORAL_TABLET | Freq: Once | ORAL | Status: AC
  Administered 2016-07-22: 10 mg via ORAL
  Filled 2016-07-22: qty 1

## 2016-07-22 MED ORDER — CYCLOBENZAPRINE HCL 10 MG PO TABS: 10.0000 mg | ORAL_TABLET | Freq: Three times a day (TID) | ORAL | 0 refills | Status: AC | PRN

## 2016-07-22 MED ORDER — TRAMADOL HCL 50 MG PO TABS: 50.0000 mg | ORAL_TABLET | Freq: Four times a day (QID) | ORAL | 0 refills | Status: DC | PRN

## 2016-07-22 MED ORDER — IBUPROFEN 800 MG PO TABS
800.0000 mg | ORAL_TABLET | Freq: Once | ORAL | Status: AC
  Administered 2016-07-22: 800 mg via ORAL
  Filled 2016-07-22: qty 1

## 2016-07-22 MED ORDER — TRAMADOL HCL 50 MG PO TABS
50.0000 mg | ORAL_TABLET | Freq: Once | ORAL | Status: AC
  Administered 2016-07-22: 50 mg via ORAL
  Filled 2016-07-22: qty 1

## 2016-07-22 NOTE — ED Triage Notes (Signed)
Pt c/o lower back pain with radiation into right hip and right leg.  Pt denies injury, states taking tylenol and ibuprofen without relief.

## 2016-07-22 NOTE — ED Provider Notes (Signed)
[facility] Provider Note   CSN: [id number] Arrival date & time: 07/22/16  0118     History   Chief Complaint Chief Complaint  Patient presents with  . Back Pain    HPI Alan Park is a 41 y.o. male.  He complains of pain in the right lower back with radiation to the right hip and thigh for the last 2 days. He started a new job which does involve a lot of lifting. He rates pain at 8/10. There is no numbness or tingling and no difficulty with bowel or bladder. He denies any weakness. He has had similar pains in the past. He has taken acetaminophen, ibuprofen, and BC powder with little to no relief. Pain is rated at 8/10. Pain is worse with movement. Nothing makes it better. Also, he states that he ran out of his blood pressure medicine, lisinopril-hydrochlorothiazide, 2 days ago and is requesting a refill. He currently does not have a PCP, and recently lost his insurance.    Back Pain      Past Medical History:  Diagnosis Date  . Asthma   . Back pain, chronic   . Hypertension   . Kidney stone   . Kidney stones     There are no active problems to display for this patient.   History reviewed. No pertinent surgical history.     Home Medications    Prior to Admission medications   Medication Sig Start Date End Date Taking? Authorizing Provider  albuterol (PROVENTIL HFA;VENTOLIN HFA) 108 (90 Base) MCG/ACT inhaler Inhale 2 puffs into the lungs every 6 (six) hours as needed for wheezing or shortness of breath.    Historical Provider, MD  cyclobenzaprine (FLEXERIL) 10 MG tablet Take 1 tablet (10 mg total) by mouth 3 (three) times daily as needed for muscle spasms. 07/22/16   Dione Boozeavid Corrissa Martello, MD  lisinopril-hydrochlorothiazide (PRINZIDE,ZESTORETIC) 10-12.5 MG tablet Take 1 tablet by mouth daily. 07/22/16   Dione Boozeavid Temple Sporer, MD  traMADol (ULTRAM) 50 MG tablet Take 1 tablet (50 mg total) by mouth every 6 (six) hours as needed. 07/22/16   Dione Boozeavid Charnelle Bergeman, MD    Family  History Family History  Problem Relation Age of Onset  . Nephrolithiasis Father     Social History Social History  Substance Use Topics  . Smoking status: Current Every Day Smoker    Packs/day: 1.00    Years: 10.00    Types: Cigarettes  . Smokeless tobacco: Never Used  . Alcohol use Yes     Comment: Occasionally     Allergies   Patient has no known allergies.   Review of Systems Review of Systems  Musculoskeletal: Positive for back pain.  All other systems reviewed and are negative.    Physical Exam Updated Vital Signs BP (!) 138/107 (BP Location: Left Arm)   Pulse 62   Temp 98.4 F (36.9 C) (Oral)   Resp 20   Ht 5\' 8"  (1.727 m)   Wt 150 lb (68 kg)   SpO2 97%   BMI 22.81 kg/m   Physical Exam  Nursing note and vitals reviewed.  41 year old male, resting comfortably and in no acute distress. Vital signs are Significant for hypertension. Oxygen saturation is 97%, which is normal. Head is normocephalic and atraumatic. PERRLA, EOMI. Oropharynx is clear. Neck is nontender and supple without adenopathy or JVD. Back has moderate tenderness throughout the lumbar area with moderate to severe bilateral paralumbar spasm. Straight leg raise is positive on the left at 45,  and on the right at 30. There is no CVA tenderness. Lungs are clear without rales, wheezes, or rhonchi. Chest is nontender. Heart has regular rate and rhythm without murmur. Abdomen is soft, flat, nontender without masses or hepatosplenomegaly and peristalsis is normoactive. Extremities have no cyanosis or edema, full range of motion is present. Skin is warm and dry without rash. Neurologic: Mental status is normal, cranial nerves are intact, there are no motor or sensory deficits.  ED Treatments / Results   Procedures Procedures (including critical care time)  Medications Ordered in ED Medications  traMADol (ULTRAM) tablet 50 mg (not administered)  cyclobenzaprine (FLEXERIL) tablet 10 mg (not  administered)  ibuprofen (ADVIL,MOTRIN) tablet 800 mg (not administered)     Initial Impression / Assessment and Plan / ED Course  I have reviewed the triage vital signs and the nursing notes.  Pertinent labs & imaging results that were available during my care of the patient were reviewed by me and considered in my medical decision making (see chart for details).  Low back pain with right-sided sciatica. All records are reviewed, and he has several prior ED visits with similar complaints. No evidence of any neurologic injury. No indication for acute imaging. As to apply ice, take over-the-counter naproxen. Prescriptions are given for cyclobenzaprine and tramadol. New prescription is given for lisinopril-hydrochlorothiazide. He is also given financial resources to try to establish PCP.  Final Clinical Impressions(s) / ED Diagnoses   Final diagnoses:  Acute right-sided low back pain with right-sided sciatica  Essential hypertension    New Prescriptions New Prescriptions   CYCLOBENZAPRINE (FLEXERIL) 10 MG TABLET    Take 1 tablet (10 mg total) by mouth 3 (three) times daily as needed for muscle spasms.   TRAMADOL (ULTRAM) 50 MG TABLET    Take 1 tablet (50 mg total) by mouth every 6 (six) hours as needed.     Dione Booze, MD 07/22/16 [phone]

## 2016-07-22 NOTE — Discharge Instructions (Signed)
Take two naproxen (Aleve) tablets at a time, twice a day. Take acetaminophen (Tylenol) as needed for additional pain relief. °

## 2017-05-24 ENCOUNTER — Encounter (HOSPITAL_COMMUNITY): Payer: Self-pay | Admitting: *Deleted

## 2017-05-24 ENCOUNTER — Emergency Department (HOSPITAL_COMMUNITY)
Admission: EM | Admit: 2017-05-24 | Discharge: 2017-05-24 | Disposition: A | Discharge status: Home / Self Care | Payer: Self-pay | Attending: Emergency Medicine | Admitting: Emergency Medicine

## 2017-05-24 ENCOUNTER — Other Ambulatory Visit: Payer: Self-pay

## 2017-05-24 DIAGNOSIS — S39012A Strain of muscle, fascia and tendon of lower back, initial encounter: Secondary | ICD-10-CM | POA: Insufficient documentation

## 2017-05-24 DIAGNOSIS — Y998 Other external cause status: Secondary | ICD-10-CM | POA: Insufficient documentation

## 2017-05-24 DIAGNOSIS — I10 Essential (primary) hypertension: Secondary | ICD-10-CM | POA: Insufficient documentation

## 2017-05-24 DIAGNOSIS — X509XXA Other and unspecified overexertion or strenuous movements or postures, initial encounter: Secondary | ICD-10-CM | POA: Insufficient documentation

## 2017-05-24 DIAGNOSIS — F17210 Nicotine dependence, cigarettes, uncomplicated: Secondary | ICD-10-CM | POA: Insufficient documentation

## 2017-05-24 DIAGNOSIS — Y929 Unspecified place or not applicable: Secondary | ICD-10-CM | POA: Insufficient documentation

## 2017-05-24 DIAGNOSIS — Y9389 Activity, other specified: Secondary | ICD-10-CM | POA: Insufficient documentation

## 2017-05-24 DIAGNOSIS — Z79899 Other long term (current) drug therapy: Secondary | ICD-10-CM | POA: Insufficient documentation

## 2017-05-24 DIAGNOSIS — J45909 Unspecified asthma, uncomplicated: Secondary | ICD-10-CM | POA: Insufficient documentation

## 2017-05-24 MED ORDER — METHOCARBAMOL 500 MG PO TABS
500.0000 mg | ORAL_TABLET | Freq: Once | ORAL | Status: AC
  Administered 2017-05-24: 500 mg via ORAL
  Filled 2017-05-24: qty 1

## 2017-05-24 MED ORDER — METHOCARBAMOL 500 MG PO TABS: 500.0000 mg | ORAL_TABLET | Freq: Two times a day (BID) | ORAL | 0 refills | Status: AC | PRN

## 2017-05-24 MED ORDER — KETOROLAC TROMETHAMINE 60 MG/2ML IM SOLN
60.0000 mg | Freq: Once | INTRAMUSCULAR | Status: AC
  Administered 2017-05-24: 60 mg via INTRAMUSCULAR
  Filled 2017-05-24: qty 2

## 2017-05-24 MED ORDER — LISINOPRIL-HYDROCHLOROTHIAZIDE 10-12.5 MG PO TABS: 1.0000 | ORAL_TABLET | Freq: Every day | ORAL | 1 refills | Status: DC

## 2017-05-24 MED ORDER — IBUPROFEN 600 MG PO TABS: 600.0000 mg | ORAL_TABLET | Freq: Four times a day (QID) | ORAL | 0 refills | Status: DC | PRN

## 2017-05-24 NOTE — ED Triage Notes (Signed)
C/o back pain, states he has been lifting and moving furniture today and back started hurting this evening

## 2017-05-24 NOTE — Discharge Instructions (Signed)
Back Pain:   Your back pain should be treated with medicines such as ibuprofen or aleve and this back pain should get better over the next 2 weeks.  However if you develop severe or worsening pain, low back pain with fever, numbness, weakness or inability to walk or urinate, you should return to the ER immediately.  Please follow up with your doctor this week for a recheck if still having symptoms. Low back pain is discomfort in the lower back that may be due to injuries to muscles and ligaments around the spine.  Occasionally, it may be caused by a a problem to a part of the spine called a disc.  The pain may last several days or a week;  However, most patients get completely well in 4 weeks.  Self - care:  The application of heat can help soothe the pain.  Maintaining your daily activities, including walking, is encourged, as it will help you get better faster than just staying in bed.  Medications are also useful to help with pain control.  A commonly prescribed medications includes acetaminophen.  This medication is generally safe, though you should not take more than 8 of the extra strength (500mg ) pills a day.  Non steroidal anti inflammatory medications including Ibuprofen and naproxen;  These medications help both pain and swelling and are very useful in treating back pain.  They should be taken with food, as they can cause stomach upset, and more seriously, stomach bleeding.    Muscle relaxants:  These medications can help with muscle tightness that is a cause of lower back pain.  Most of these medications can cause drowsiness, and it is not safe to drive or use dangerous machinery while taking them.  You will need to follow up with  Your primary healthcare provider in 1-2 weeks for reassessment.  Be aware that if you develop new symptoms, such as a fever, leg weakness, difficulty with or loss of control of your urine or bowels, abdominal pain, or more severe pain, you will need to seek  medical attention and  / or return to the Emergency department.  If you do not have a doctor see the list below.  [facility] Primary Care Doctor List    [facility] MD. Specialty: Pulmonary Disease Contact information: [address]  [address]  [place]  [address]   [phone], MD. Specialty: [facility] Medicine Contact information: [address]  [place]  [phone]   [phone], MD. Specialty: [facility] Medicine Contact information: [address]  [place]  [phone]   [phone], MD Specialty: Internal Medicine Contact information: [address] [place]  [phone]   [phone], MD. Specialty: Internal Medicine Contact information: [address]  [place]  [phone]    [facility] (Dr. [facility]) Specialty: Family Medicine Contact information: [address]  [place]  [phone]   [phone], MD. Specialty: [facility] Medicine Contact information: [address]  [address]  [place]  [phone]   [phone], MD. Specialty: Internal Medicine Contact information: [address]  [address]  [place]  [phone]    [facility]  [address] [place], [place] [phone]  Services [facility] offers a variety of basic health services.  Services include but are not limited to: Blood pressure checks  Heart rate checks  Blood sugar checks  Urine analysis  Rapid strep tests  Pregnancy tests.  Health education and referrals  People needing more complex services will be directed to a physician online. Using these virtual visits, doctors can evaluate and prescribe medicine and treatments. There will be no medication on-site, though [facility] will help patients fill their prescriptions at  little to no cost.   For More information please go to: [email or web address]

## 2017-05-24 NOTE — ED Provider Notes (Signed)
[facility] EMERGENCY DEPARTMENT Provider Note   CSN: [id number] Arrival date & time: 05/24/17  1655     History   Chief Complaint Chief Complaint  Patient presents with  . Back Pain    HPI Cherre BlancMichael K Hipple is a 41 y.o. male.  HPI  The patient is a 41 year old male, he has a history of chronic back pain as well as high blood pressure and kidney stones, he reports that he has been off of his blood pressure medication for a couple of months as an aside.  He reports that after moving heavy furniture today with his son he developed acute onset of back pain while he was lifting.  This pain is located in the lower back, it is both right and left as well as the mid back.  He has no numbness or weakness of the legs and is been able to ambulate.  The pain is definitely worse with any movement with rotation flexion or extension.  It does seem to get better when he holds still.  He has had no medication prior to arrival.  The patient denies any prior back surgery  Past Medical History:  Diagnosis Date  . Asthma   . Back pain, chronic   . Hypertension   . Kidney stone   . Kidney stones     There are no active problems to display for this patient.   History reviewed. No pertinent surgical history.     Home Medications    Prior to Admission medications   Medication Sig Start Date End Date Taking? Authorizing Provider  albuterol (PROVENTIL HFA;VENTOLIN HFA) 108 (90 Base) MCG/ACT inhaler Inhale 2 puffs into the lungs every 6 (six) hours as needed for wheezing or shortness of breath.    [provider]  cyclobenzaprine (FLEXERIL) 10 MG tablet Take 1 tablet (10 mg total) by mouth 3 (three) times daily as needed for muscle spasms. 07/22/16   Dione BoozeGlick, David, MD  ibuprofen (ADVIL,MOTRIN) 600 MG tablet Take 1 tablet (600 mg total) by mouth every 6 (six) hours as needed. 05/24/17   Eber HongMiller, Morgana Rowley, MD  lisinopril-hydrochlorothiazide (PRINZIDE,ZESTORETIC) 10-12.5 MG tablet Take 1 tablet  by mouth daily. 07/22/16   Dione BoozeGlick, David, MD  methocarbamol (ROBAXIN) 500 MG tablet Take 1 tablet (500 mg total) by mouth 2 (two) times daily as needed for muscle spasms. 05/24/17   Eber HongMiller, Janitza Revuelta, MD  traMADol (ULTRAM) 50 MG tablet Take 1 tablet (50 mg total) by mouth every 6 (six) hours as needed. 07/22/16   Dione BoozeGlick, David, MD    Family History Family History  Problem Relation Age of Onset  . Nephrolithiasis Father     Social History Social History   Tobacco Use  . Smoking status: Current Every Day Smoker    Packs/day: 1.00    Years: 10.00    Pack years: 10.00    Types: Cigarettes  . Smokeless tobacco: Never Used  Substance Use Topics  . Alcohol use: Yes    Comment: Occasionally  . Drug use: No     Allergies   Patient has no known allergies.   Review of Systems Review of Systems  Constitutional: Negative for chills and fever.  Cardiovascular: Negative for leg swelling.  Gastrointestinal: Negative for nausea and vomiting.       No incontinence of bowel  Genitourinary: Negative for difficulty urinating.       No incontinence or retention  Musculoskeletal: Positive for back pain. Negative for neck pain.  Skin: Negative for rash.  Neurological: Negative for weakness and numbness.     Physical Exam Updated Vital Signs BP (!) 155/115 (BP Location: Right Arm)   Pulse (!) 107   Temp 98.4 F (36.9 C) (Oral)   Resp (!) 22   Ht 5\' 8"  (1.727 m)   Wt 63.5 kg (140 lb)   SpO2 100%   BMI 21.29 kg/m   Physical Exam  Constitutional: He appears well-developed and well-nourished. No distress.  HENT:  Head: Normocephalic and atraumatic.  Eyes: Conjunctivae are normal. Right eye exhibits no discharge. Left eye exhibits no discharge. No scleral icterus.  Cardiovascular: Normal rate and regular rhythm.  Pulmonary/Chest: Effort normal and breath sounds normal.  Musculoskeletal: He exhibits no edema.  Tenderness of the back over the paraspinal muscles of the bilateral lumbar  spine as well as mild tenderness over the midline.  There is no thoracic or cervical tenderness No tenderness over the Cervical, Thoracic spine  Neurological:  Speech is clear, strength in the UE and LE's are normal at the major muscle groups including the hip, knee and ankles.  Sensation in tact to light touch and pin prick of the bilateral LE's.  Normal reflexes at the knees bilaterally.  Gait antalgic secondary to pain   Skin: Skin is warm and dry. No rash noted. He is not diaphoretic.     ED Treatments / Results  Labs (all labs ordered are listed, but only abnormal results are displayed) Labs Reviewed - No data to display   Radiology No results found.  Procedures Procedures (including critical care time)  Medications Ordered in ED Medications  ketorolac (TORADOL) injection 60 mg (60 mg Intramuscular Given 05/24/17 1846)  methocarbamol (ROBAXIN) tablet 500 mg (500 mg Oral Given 05/24/17 1846)     Initial Impression / Assessment and Plan / ED Course  I have reviewed the triage vital signs and the nursing notes.  Pertinent labs & imaging results that were available during my care of the patient were reviewed by me and considered in my medical decision making (see chart for details).    The patient's exam is consistent with a lumbar strain, his neurologic exam is unremarkable, he will be given a shot of Toradol with Robaxin and will be encouraged to follow-up very closely.  He was given a follow-up list and expressed his understanding to the indications for return.  Given the predominance of paraspinal pain over spinal pain that makes it less likely for this to be pathological, he is not febrile and has no neurologic symptoms or history of IV drug use to suggest a pathologic cause.  It also came on while he was lifting a heavy item suggestive of muscular or ligamentous injury.  Final Clinical Impressions(s) / ED Diagnoses   Final diagnoses:  Strain of lumbar region, initial  encounter    ED Discharge Orders        Ordered    ibuprofen (ADVIL,MOTRIN) 600 MG tablet  Every 6 hours PRN     05/24/17 1840    methocarbamol (ROBAXIN) 500 MG tablet  2 times daily PRN     05/24/17 1840       Eber HongMiller, Kaydon Husby, MD 05/24/17 1850

## 2017-09-22 IMAGING — US US RENAL
1 series · 14 of 25 positions shown · non-contrast
Comparison: 07/04/2015

CLINICAL DATA: Left flank pain

EXAM:
RENAL / URINARY TRACT ULTRASOUND COMPLETE

[Series 1: us renal · 0.23mm/px · 14 of 49 slices shown]
[im 1/49]
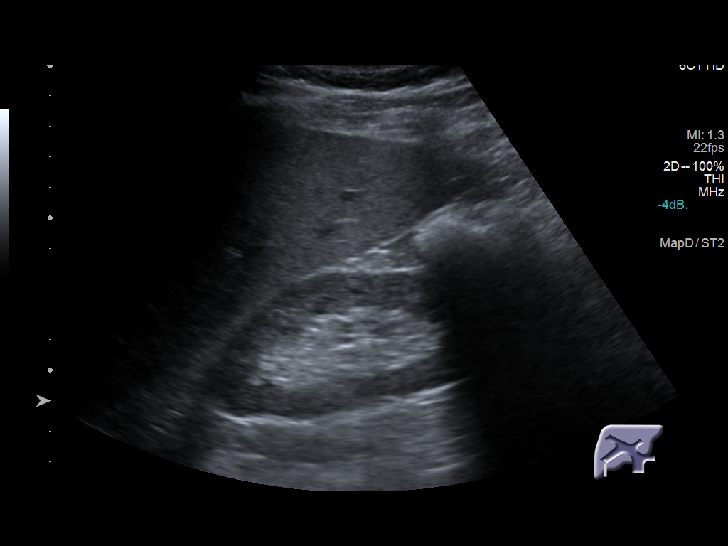
[im 5/49]
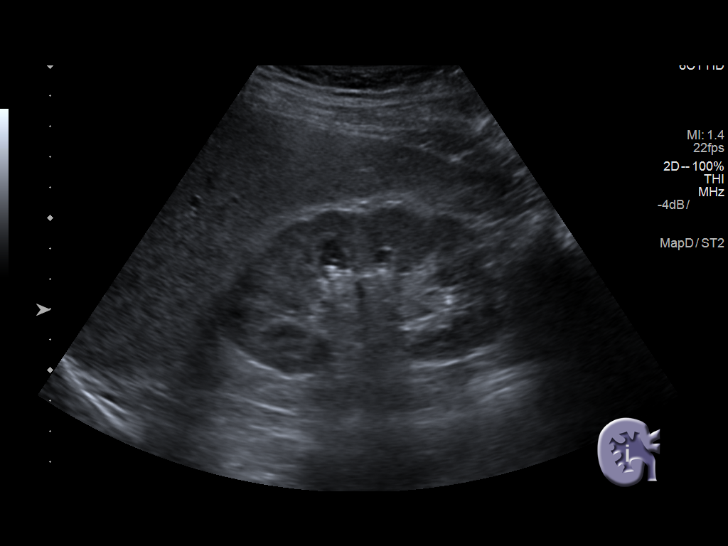
[im 9/49]
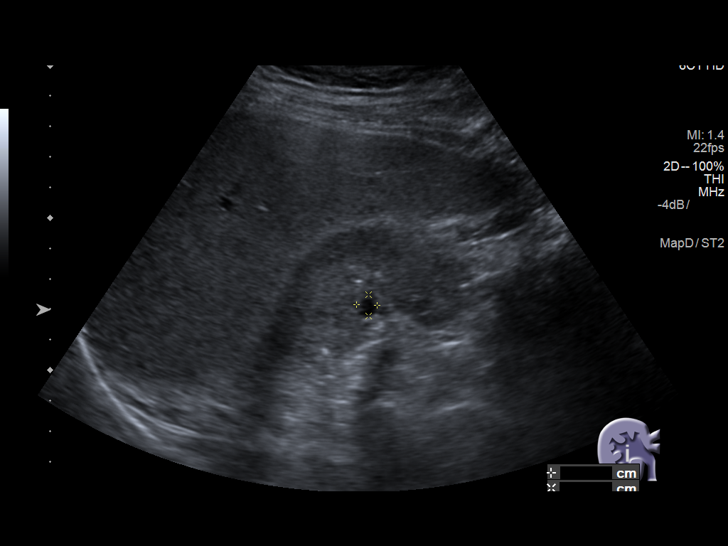
[im 13/49]
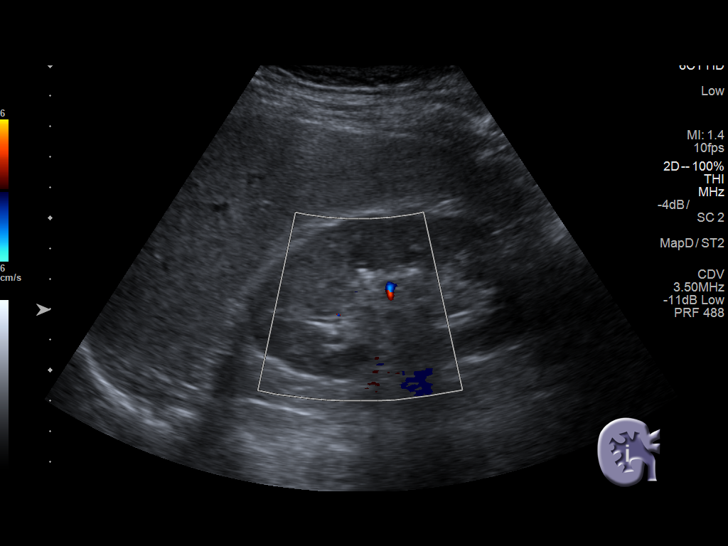
[im 17/49]
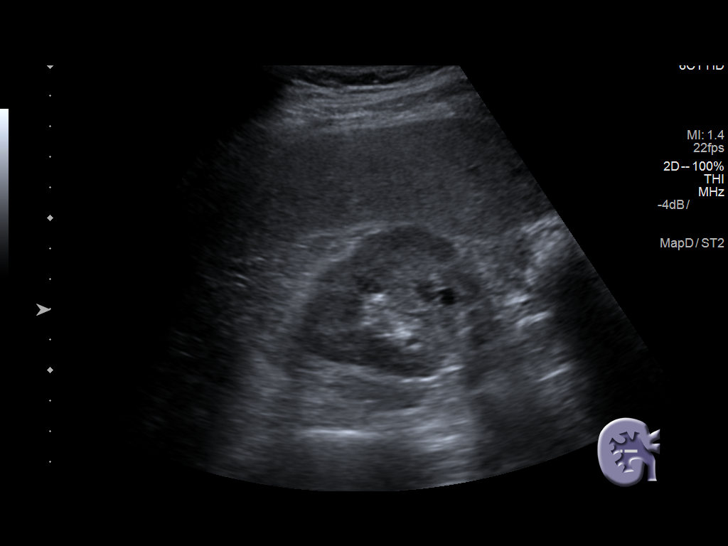
[im 19/49]
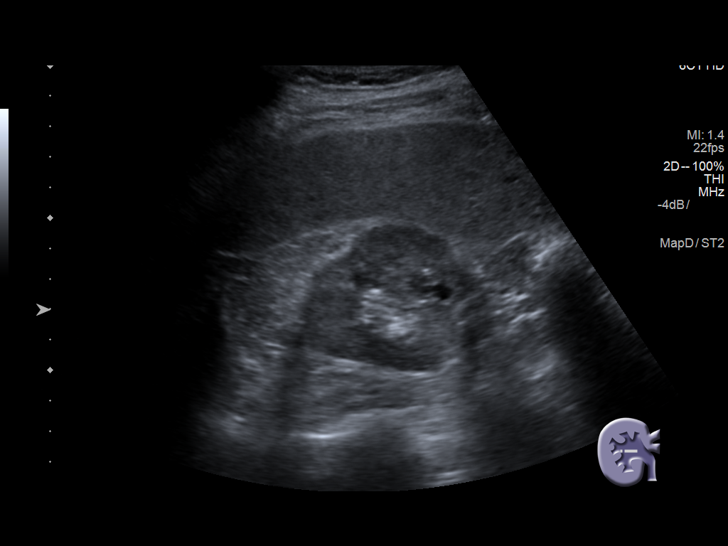
[im 23/49]
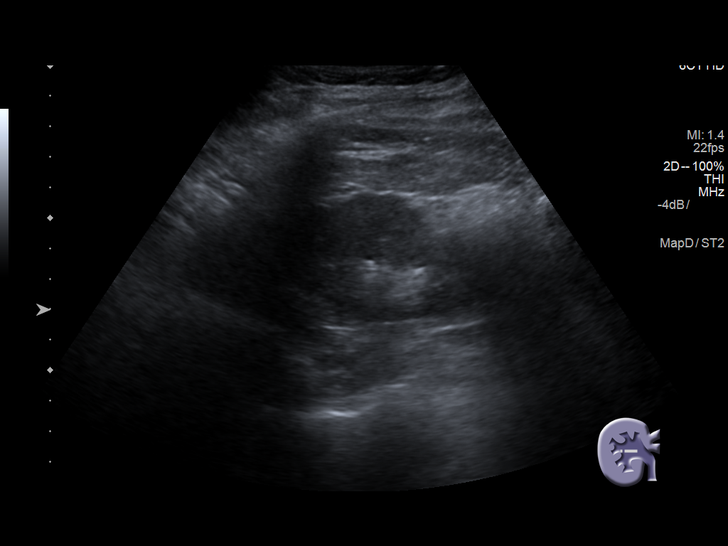
[im 27/49]
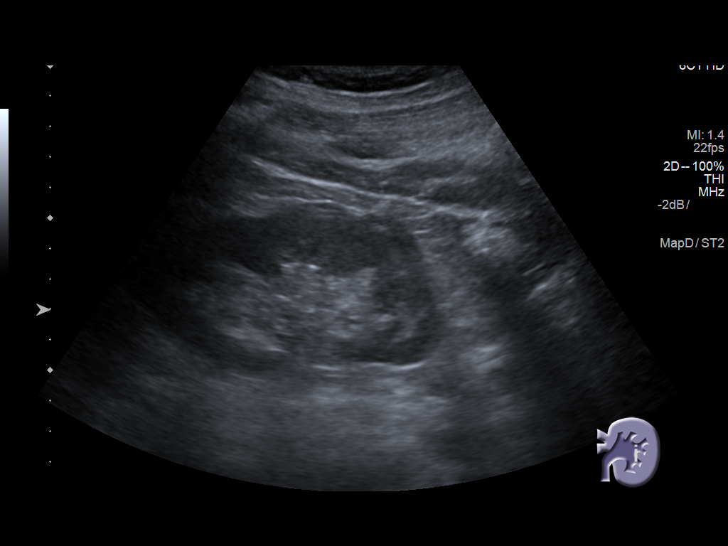
[im 31/49]
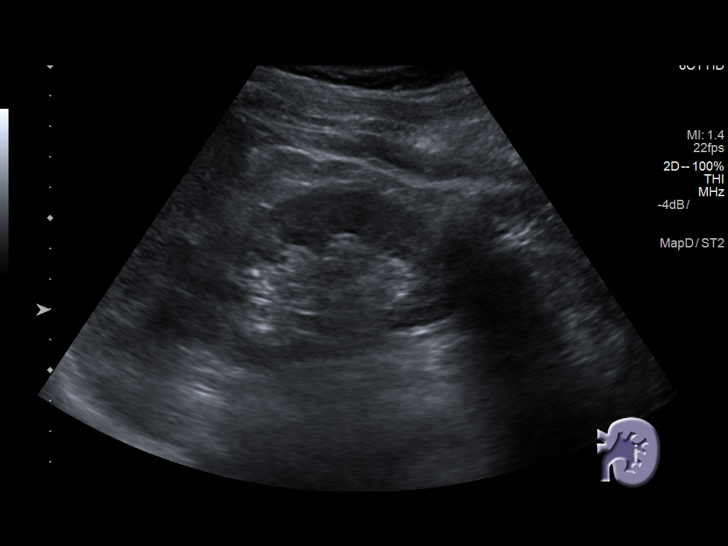
[im 33/49]
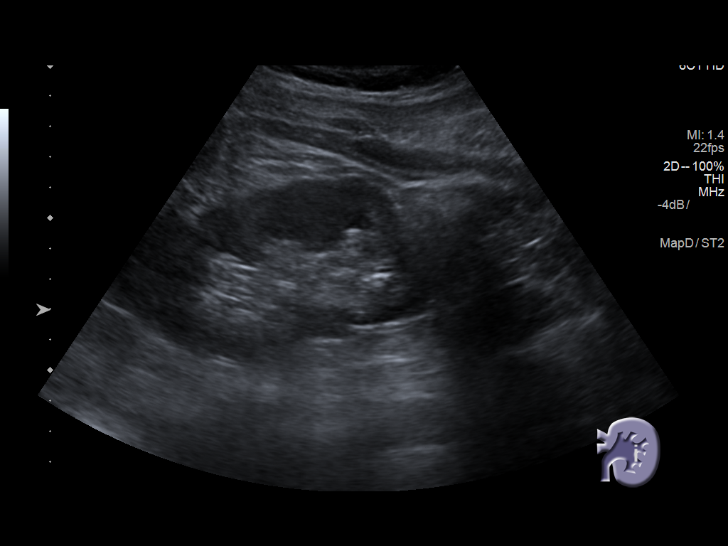
[im 37/49]
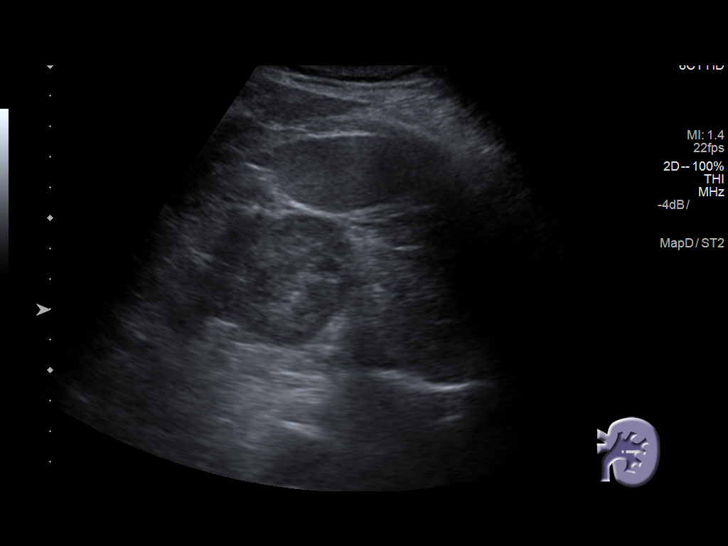
[im 41/49]
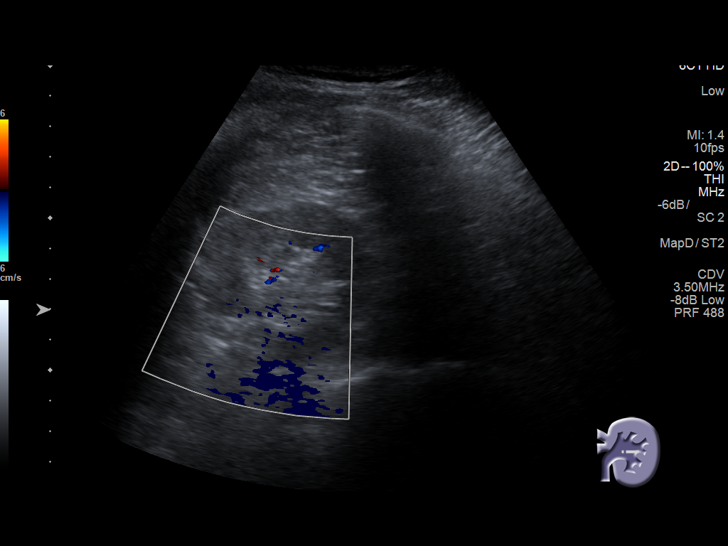
[im 45/49]
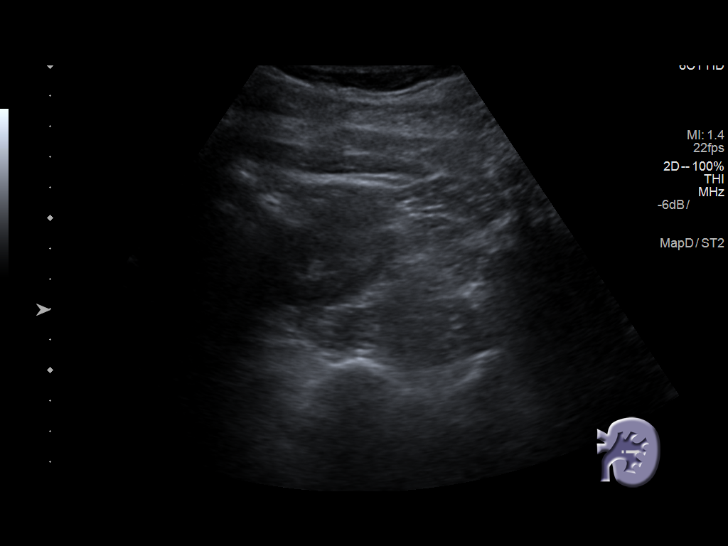
[im 49/49]
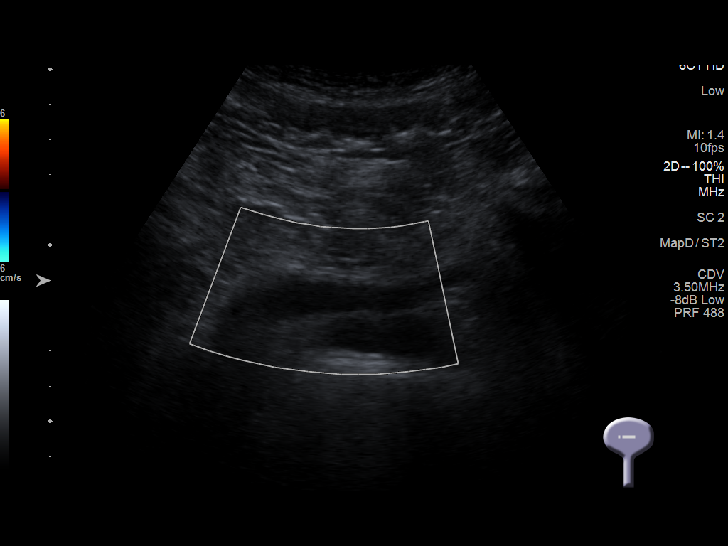

[14 of 25 positions shown; findings below may reference images not displayed]

FINDINGS: Right Kidney:

Length: 11.9 cm. Normal parenchymal echogenicity. Small cyst in the
upper pole measuring 7 mm, not seen prior exam. Echogenic foci are
noted most likely nonobstructing intrarenal stones. These could be
vascular calcifications no hydronephrosis. No solid renal masses.
The

Left Kidney:

Length: 11.6 cm. Normal parenchymal echogenicity. No masses. No
hydronephrosis. Small echogenic foci consistent with nonobstructing
intrarenal or vascular calcifications or a combination.

Bladder:

Decompressed and not well evaluated.
IMPRESSION: 1. No acute finding.  No hydronephrosis.
2. Small echogenic foci each kidney with nonobstructing intrarenal
stones, vascular calcifications or a combination. These are stable.
3. Small right renal cyst not seen previously.  No other change.

## 2020-04-16 ENCOUNTER — Emergency Department (HOSPITAL_COMMUNITY)
Admission: EM | Admit: 2020-04-16 | Discharge: 2020-04-16 | Disposition: A | Discharge status: Home / Self Care | Payer: Medicaid - Out of State

## 2020-04-16 ENCOUNTER — Other Ambulatory Visit: Payer: Self-pay

## 2020-05-02 ENCOUNTER — Other Ambulatory Visit: Payer: Self-pay

## 2020-05-02 ENCOUNTER — Encounter (HOSPITAL_COMMUNITY): Payer: Self-pay | Admitting: Emergency Medicine

## 2020-05-02 ENCOUNTER — Emergency Department (HOSPITAL_COMMUNITY)
Admission: EM | Admit: 2020-05-02 | Discharge: 2020-05-02 | Disposition: A | Discharge status: Home / Self Care | Payer: Medicaid - Out of State | Attending: Emergency Medicine | Admitting: Emergency Medicine

## 2020-05-02 DIAGNOSIS — M5459 Other low back pain: Secondary | ICD-10-CM | POA: Diagnosis present

## 2020-05-02 DIAGNOSIS — F17210 Nicotine dependence, cigarettes, uncomplicated: Secondary | ICD-10-CM | POA: Diagnosis not present

## 2020-05-02 DIAGNOSIS — S32020D Wedge compression fracture of second lumbar vertebra, subsequent encounter for fracture with routine healing: Secondary | ICD-10-CM | POA: Diagnosis not present

## 2020-05-02 DIAGNOSIS — I10 Essential (primary) hypertension: Secondary | ICD-10-CM | POA: Insufficient documentation

## 2020-05-02 DIAGNOSIS — M545 Low back pain, unspecified: Secondary | ICD-10-CM

## 2020-05-02 DIAGNOSIS — W108XXD Fall (on) (from) other stairs and steps, subsequent encounter: Secondary | ICD-10-CM | POA: Insufficient documentation

## 2020-05-02 DIAGNOSIS — X500XXD Overexertion from strenuous movement or load, subsequent encounter: Secondary | ICD-10-CM | POA: Diagnosis not present

## 2020-05-02 DIAGNOSIS — Z79899 Other long term (current) drug therapy: Secondary | ICD-10-CM | POA: Insufficient documentation

## 2020-05-02 MED ORDER — HYDROCODONE-ACETAMINOPHEN 5-325 MG PO TABS
1.0000 | ORAL_TABLET | Freq: Once | ORAL | Status: AC
  Administered 2020-05-02: 1 via ORAL
  Filled 2020-05-02: qty 1

## 2020-05-02 MED ORDER — HYDROCODONE-ACETAMINOPHEN 5-325 MG PO TABS: ORAL_TABLET | ORAL | 0 refills | Status: DC

## 2020-05-02 MED ORDER — LISINOPRIL 10 MG PO TABS
10.0000 mg | ORAL_TABLET | Freq: Once | ORAL | Status: AC
  Administered 2020-05-02: 10 mg via ORAL
  Filled 2020-05-02: qty 1

## 2020-05-02 NOTE — ED Provider Notes (Signed)
[facility] EMERGENCY DEPARTMENT Provider Note   CSN: [id number] Arrival date & time: 05/02/20  9373     History Chief Complaint  Patient presents with  . Back Pain    DIVIT STIPP is a 44 y.o. male.  HPI      TREQUAN MARSOLEK is a 44 y.o. male who presents to the Emergency Department complaining of left-sided low back pain for 2 weeks.  Pain is secondary to a mechanical fall that occurred nearly 2 weeks ago.  He states that he was carrying a close dryer down some steps when he fell.  He reports having worsening low back pain since.  He was seen at [facility] emergency department on 04/17/2020 received CT imaging of his thoracic spine that showed an L2 compression fracture with loss of vertebral body height of 50%.  TLSO brace has been ordered and patient has referral to orthopedics in [place].  He was advised to alternate Tylenol and ibuprofen for his pain.  He was also given a prescription for a muscle relaxer.  He states the medications are not helping with his pain and he is here today seeking pain control.  Denies progressing symptoms, fever, abdominal pain, urine or bowel changes, pain numbness or weakness radiating into his lower extremities.  Past Medical History:  Diagnosis Date  . Asthma   . Back pain, chronic   . Hypertension   . Kidney stone   . Kidney stones     There are no problems to display for this patient.   History reviewed. No pertinent surgical history.     Family History  Problem Relation Age of Onset  . Nephrolithiasis Father     Social History   Tobacco Use  . Smoking status: Current Every Day Smoker    Packs/day: 1.00    Years: 10.00    Pack years: 10.00    Types: Cigarettes  . Smokeless tobacco: Never Used  Substance Use Topics  . Alcohol use: Yes    Comment: Occasionally  . Drug use: No    Home Medications Prior to Admission medications   Medication Sig Start Date End Date Taking? Authorizing Provider    albuterol (PROVENTIL HFA;VENTOLIN HFA) 108 (90 Base) MCG/ACT inhaler Inhale 2 puffs into the lungs every 6 (six) hours as needed for wheezing or shortness of breath.    [provider]  cyclobenzaprine (FLEXERIL) 10 MG tablet Take 1 tablet (10 mg total) by mouth 3 (three) times daily as needed for muscle spasms. 07/22/16   Dione Booze, MD  ibuprofen (ADVIL,MOTRIN) 600 MG tablet Take 1 tablet (600 mg total) by mouth every 6 (six) hours as needed. 05/24/17   Eber Hong, MD  lisinopril-hydrochlorothiazide (PRINZIDE,ZESTORETIC) 10-12.5 MG tablet Take 1 tablet by mouth daily. 05/24/17   Eber Hong, MD  methocarbamol (ROBAXIN) 500 MG tablet Take 1 tablet (500 mg total) by mouth 2 (two) times daily as needed for muscle spasms. 05/24/17   Eber Hong, MD  traMADol (ULTRAM) 50 MG tablet Take 1 tablet (50 mg total) by mouth every 6 (six) hours as needed. 07/22/16   Dione Booze, MD    Allergies    Patient has no known allergies.  Review of Systems   Review of Systems  Constitutional: Negative for fever.  Respiratory: Negative for shortness of breath.   Gastrointestinal: Negative for abdominal pain, constipation and vomiting.  Genitourinary: Negative for decreased urine volume, difficulty urinating, dysuria, flank pain and hematuria.  Musculoskeletal: Positive for back pain. Negative  for joint swelling.  Skin: Negative for rash.  Neurological: Negative for weakness and numbness.    Physical Exam Updated Vital Signs BP (!) 142/99   Pulse 79   Temp 99.2 F (37.3 C)   Resp 18   Ht 5\' 8"  (1.727 m)   Wt 68 kg   SpO2 91%   BMI 22.81 kg/m   Physical Exam Vitals and nursing note reviewed.  Constitutional:      General: He is not in acute distress.    Appearance: Normal appearance. He is well-developed.  HENT:     Head: Atraumatic.  Cardiovascular:     Rate and Rhythm: Normal rate and regular rhythm.     Pulses: Normal pulses.     Comments: DP pulses are strong and palpable  bilaterally Pulmonary:     Effort: Pulmonary effort is normal. No respiratory distress.     Breath sounds: Normal breath sounds.  Abdominal:     General: There is no distension.     Palpations: Abdomen is soft.     Tenderness: There is no abdominal tenderness.  Musculoskeletal:        General: Tenderness present.     Cervical back: Normal range of motion and neck supple.     Lumbar back: Tenderness present. No swelling, deformity or lacerations. Normal range of motion.     Comments: Focal tenderness of the upper lumbar spine and left adjacent paraspinal muscles.  No bony step-offs.  No SI tenderness.  Patient has negative straight leg raise bilaterally.  Skin:    General: Skin is warm.     Capillary Refill: Capillary refill takes less than 2 seconds.     Findings: No rash.  Neurological:     General: No focal deficit present.     Mental Status: He is alert.     Sensory: Sensation is intact. No sensory deficit.     Motor: Motor function is intact. No weakness or abnormal muscle tone.     Gait: Gait normal.     Deep Tendon Reflexes:     Reflex Scores:      Patellar reflexes are 2+ on the right side and 2+ on the left side.      Achilles reflexes are 2+ on the right side and 2+ on the left side.    ED Results / Procedures / Treatments   Labs (all labs ordered are listed, but only abnormal results are displayed) Labs Reviewed - No data to display  EKG None  Radiology No results found.  Procedures Procedures (including critical care time)  Medications Ordered in ED Medications - No data to display  ED Course  I have reviewed the triage vital signs and the nursing notes.  Pertinent labs & imaging results that were available during my care of the patient were reviewed by me and considered in my medical decision making (see chart for details).    MDM Rules/Calculators/A&P                          Patient here requesting pain control secondary to a L2 compression  fracture.  Fracture secondary to a fall.  He was seen at another emergency department and CT confirmed the fracture.  I have reviewed patient's medical records and confirmed the vertebral fracture.  He has been taking muscle relaxer and over-the-counter medication without relief.  On exam, no acute focal deficits.  No saddle anesthesias or other concerning symptoms for cauda equina.  A TLSO brace has been ordered from his previous ER visit that he is supposed to receive on Monday.  He also has follow-up orthopedic appointment pending.  I feel that a short course of pain medication is reasonable given acute fracture, narcotic database reviewed.  No indication for additional imaging, patient appears appropriate for discharge home will continue NSAIDs therapy    Final Clinical Impression(s) / ED Diagnoses Final diagnoses:  Acute left-sided low back pain without sciatica  Closed compression fracture of L2 lumbar vertebra with routine healing, subsequent encounter    Rx / DC Orders ED Discharge Orders    None       Pauline Aus, PA-C 05/03/20 1051    Bethann Berkshire, MD 05/04/20 [phone]

## 2020-05-02 NOTE — ED Notes (Signed)
BP noted and communicated to Colman Triplet, PA.

## 2020-05-02 NOTE — ED Triage Notes (Signed)
Pt fell 2 weeks ago and sent to [facility] to torso back brace.  Pt to pick up brace next week.  Pt here today pain meds.  Rates lower back pain 8/10.

## 2020-05-02 NOTE — Discharge Instructions (Addendum)
As discussed, the CT scan you had at [facility] shows a compression fracture of your L2 vertebrae.  Please follow-up with your orthopedic provider and I have also listed a orthopedic provider here in [place] for you if needed.  Make sure that you follow-up to get the TLSO brace that has been ordered.  Continue taking ibuprofen as directed.

## 2020-05-28 ENCOUNTER — Telehealth: Payer: Self-pay | Admitting: Orthopedic Surgery

## 2020-05-28 NOTE — Telephone Encounter (Signed)
I would have him f/u with who the [facility] ED referred him to.  It is in the discharge note that is in Care Everywhere.  This Remingtyn Depaola get him seen sooner.  I am not sure we can see him as soon as he Gianna Calef want.  That d/c note also mentions referral for a TLSO brace and to an Ortho in [place].  Has he gotten the brace?  Or seen the ortho in [place]?  We have access to both ED notes, just not sure who else he Taura Lamarre have seen.  (Compression fracture per ED note.)

## 2020-05-28 NOTE — Telephone Encounter (Signed)
Patient called, 1:38pm, and left a voice message inquiring about scheduling and about self-pay policy, following [facility] visit. I returned call; spoke with patient, and with wife Marcelino Duster / ph#[phone]. Relays that initially had been seen for this injury at [facility] emergency room on 04/17/20 - states 'they did not do anything for him there.'  Relays then had family matter and did not get any further treatment at that time; then went to [facility] emergency room on 05/02/20.  States he is still in a lot of pain with this back injury ("was moving a washing machine down some stairs, slipped, and washer fell onto his back). Relays had other family issues going on, including a death in the family this morning; said this was first chance to call.   Please review and advise regarding scheduling.

## 2020-06-02 NOTE — Telephone Encounter (Signed)
Reached patient and spouse,designated contact. Relays he did see ortho at [facility], and said did receive a brace. Relayed to patient that the orthopaedic notes and Xray reports/films are needed for review, prior to scheduling appointment. I also relayed to patient, with current coverage of [place], that he is best served continuing care with this provider.  States has transportation problems and cannot get back to their office in [place]; also said is in process of moving and will be filing for [facility]. I relayed that this process can take at least a month or two.  Relayed I would try to assist with process of requesting records/notes from [facility]. Voiced understanding that appointment is pending.

## 2021-06-17 ENCOUNTER — Other Ambulatory Visit: Payer: Self-pay

## 2021-06-17 ENCOUNTER — Emergency Department (HOSPITAL_COMMUNITY): Payer: Medicaid - Out of State

## 2021-06-17 ENCOUNTER — Encounter (HOSPITAL_COMMUNITY): Payer: Self-pay

## 2021-06-17 ENCOUNTER — Emergency Department (HOSPITAL_COMMUNITY)
Admission: EM | Admit: 2021-06-17 | Discharge: 2021-06-17 | Disposition: A | Discharge status: Home / Self Care | Payer: Medicaid - Out of State | Attending: Emergency Medicine | Admitting: Emergency Medicine

## 2021-06-17 DIAGNOSIS — S299XXA Unspecified injury of thorax, initial encounter: Secondary | ICD-10-CM | POA: Diagnosis not present

## 2021-06-17 DIAGNOSIS — Z79899 Other long term (current) drug therapy: Secondary | ICD-10-CM | POA: Insufficient documentation

## 2021-06-17 DIAGNOSIS — J45909 Unspecified asthma, uncomplicated: Secondary | ICD-10-CM | POA: Insufficient documentation

## 2021-06-17 DIAGNOSIS — X509XXA Other and unspecified overexertion or strenuous movements or postures, initial encounter: Secondary | ICD-10-CM | POA: Diagnosis not present

## 2021-06-17 DIAGNOSIS — I10 Essential (primary) hypertension: Secondary | ICD-10-CM | POA: Insufficient documentation

## 2021-06-17 MED ORDER — OXYCODONE-ACETAMINOPHEN 5-325 MG PO TABS: 1.0000 | ORAL_TABLET | ORAL | 0 refills | Status: DC | PRN

## 2021-06-17 MED ORDER — LISINOPRIL-HYDROCHLOROTHIAZIDE 10-12.5 MG PO TABS: 1.0000 | ORAL_TABLET | Freq: Every day | ORAL | 0 refills | Status: DC

## 2021-06-17 MED ORDER — OXYCODONE-ACETAMINOPHEN 5-325 MG PO TABS
1.0000 | ORAL_TABLET | Freq: Once | ORAL | Status: AC
  Administered 2021-06-17: 1 via ORAL
  Filled 2021-06-17: qty 1

## 2021-06-17 NOTE — ED Notes (Signed)
Pt returned from xray

## 2021-06-17 NOTE — Discharge Instructions (Signed)
You likely have a contusion of your ribs.  There may also be a rib fracture present that we did not see on x-ray.  As discussed, use the pillow or a folded towel to apply firm pressure to your ribs to cough and take deep breaths several times throughout the day.  Also, your blood pressure is elevated.  I have refilled your antihypertensive medication.  Please take as directed.  Contact one of the providers listed to establish primary care.  Return to the emergency department for any new or worsening symptoms.

## 2021-06-17 NOTE — ED Notes (Signed)
Patient transported to X-ray 

## 2021-06-17 NOTE — ED Provider Notes (Signed)
[facility] EMERGENCY [facility]PARTMENT Provider Note   CSN: [id number] Arrival date & time: 06/17/21  1419     History  No chief complaint on file.   Alan BlancMichael K Carby is a 46 y.o. male.  HPI     Alan BlancMichael K Park is a 46 y.o. with past medical history of hypertension, asthma and chronic back pain male who presents to the Emergency Department complaining of sudden onset of left lateral chest wall pain.  He states he was sitting on the sofa and attempted to stand up and felt a sharp pain to his chest and he heard a "pop" to his chest wall.  He reports persistent pain since the incident occurred last evening.  Pain is worsened with certain movements, coughing, deep breathing and laughing.  Pain improves slightly while at rest.  He denies any shortness of breath or abdominal pain.  No nausea or vomiting.  No significant cough or hemoptysis.   Home Medications Prior to Admission medications   Medication Sig Start Date End Date Taking? Authorizing Provider  albuterol (PROVENTIL HFA;VENTOLIN HFA) 108 (90 Base) MCG/ACT inhaler Inhale 2 puffs into the lungs every 6 (six) hours as needed for wheezing or shortness of breath.    [provider]  cyclobenzaprine (FLEXERIL) 10 MG tablet Take 1 tablet (10 mg total) by mouth 3 (three) times daily as needed for muscle spasms. 07/22/16   Dione BoozeGlick, David, MD  HYDROcodone-acetaminophen (NORCO/VICODIN) 5-325 MG tablet Take one tab po q 4 hrs prn pain 05/02/20   Demarri Elie, PA-C  ibuprofen (ADVIL,MOTRIN) 600 MG tablet Take 1 tablet (600 mg total) by mouth every 6 (six) hours as needed. 05/24/17   Eber HongMiller, Brian, MD  lisinopril-hydrochlorothiazide (PRINZIDE,ZESTORETIC) 10-12.5 MG tablet Take 1 tablet by mouth daily. 05/24/17   Eber HongMiller, Brian, MD  methocarbamol (ROBAXIN) 500 MG tablet Take 1 tablet (500 mg total) by mouth 2 (two) times daily as needed for muscle spasms. 05/24/17   Eber HongMiller, Brian, MD      Allergies    Patient has no known allergies.     Review of Systems   Review of Systems  Constitutional:  Negative for fatigue and fever.  Respiratory:  Negative for cough, chest tightness and shortness of breath.   Cardiovascular:  Positive for chest pain (Left lateral chest pain).  Gastrointestinal:  Negative for abdominal pain, diarrhea, nausea and vomiting.  Genitourinary:  Negative for difficulty urinating and flank pain.  Musculoskeletal:  Negative for back pain and neck pain.  Skin:  Negative for rash and wound.  Neurological:  Negative for numbness.  All other systems reviewed and are negative.  Physical Exam Updated Vital Signs BP (!) 159/116 (BP Location: Right Arm)    Pulse (!) 108    Temp 97.9 F (36.6 C) (Oral)    Resp 18    Ht 5\' 8"  (1.727 m)    Wt 59 kg    SpO2 100%    BMI 19.77 kg/m  Physical Exam Vitals and nursing note reviewed.  Constitutional:      General: He is not in acute distress.    Appearance: Normal appearance. He is not ill-appearing.  HENT:     Head: Atraumatic.     Mouth/Throat:     Mouth: Mucous membranes are moist.  Cardiovascular:     Rate and Rhythm: Normal rate and regular rhythm.     Pulses: Normal pulses.  Pulmonary:     Effort: Pulmonary effort is normal. No respiratory distress.     Breath  sounds: No wheezing or rhonchi.     Comments: Focal tenderness to palpation of the mid lateral left chest wall.  No bony deformity, crepitus or ecchymosis. Symmetrical raise and fall of the chest Chest:     Chest wall: Tenderness present.  Abdominal:     General: There is no distension.     Palpations: Abdomen is soft.     Tenderness: There is no abdominal tenderness.  Musculoskeletal:        General: Normal range of motion.  Skin:    General: Skin is warm.     Capillary Refill: Capillary refill takes less than 2 seconds.     Findings: No rash.  Neurological:     General: No focal deficit present.     Mental Status: He is alert.     Sensory: No sensory deficit.     Motor: No weakness.     ED Results / Procedures / Treatments   Labs (all labs ordered are listed, but only abnormal results are displayed) Labs Reviewed - No data to display  EKG None  Radiology DG Ribs Unilateral W/Chest Left  Result Date: 06/17/2021 CLINICAL DATA:  Left rib pain EXAM: LEFT RIBS AND CHEST - 3+ VIEW COMPARISON:  Chest radiograph 01/14/2017 FINDINGS: The cardiomediastinal silhouette is normal. Linear opacity in the medial left lower lobe likely reflects scar. There is no other focal consolidation. There is no pulmonary edema. There is no pleural effusion or pneumothorax. No displaced rib fracture or other acute osseous abnormality is identified. IMPRESSION: No displaced rib fracture or other acute osseous abnormality identified. Electronically Signed   By: Lesia Hausen M.D.   On: 06/17/2021 14:58    Procedures Procedures    Medications Ordered in ED Medications  oxyCODONE-acetaminophen (PERCOCET/ROXICET) 5-325 MG per tablet 1 tablet (1 tablet Oral Given 06/17/21 1612)    ED Course/ Medical Decision Making/ A&P                           Medical Decision Making  This patient presents to the ED for concern of rib injury, this involves an extensive number of treatment options, and is a complaint that carries with it a high risk of complications and morbidity.    The differential diagnosis includes pneumothorax, chest wall contusion, rib fracture, PE   Co morbidities that complicate the patient evaluation  Patient is a smoker   Additional history obtained:  Additional history obtained from none External records from outside source obtained and reviewed including none       Imaging Studies ordered:  I ordered imaging studies including rib films with chest I independently visualized and interpreted imaging which showed no acute displaced rib fracture or bony deformity.  No pneumothorax. I agree with the radiologist interpretation    Medicines ordered and prescription drug  management:  I ordered medication including Percocet for chest wall pain Reevaluation of the patient after these medicines showed that the patient is improving.  Remains stable at this time.  No respiratory distress noted. I have reviewed the patients home medicines and have made adjustments as needed    Reevaluation:  After the interventions noted above, I reevaluated the patient and found that they have : improved     Dispostion:  After consideration of the diagnostic results and the patients response to treatment, discussed possible occult fx, he is agreeable to symptomatic tx and close out pt f/u.  Pt also noted to be hypertensive and notes  he is out of his anti hypertensive medication.  Will provide refill.  He appears approp for d/c home, return precautions discussed.          Final Clinical Impression(s) / ED Diagnoses Final diagnoses:  Rib injury  Hypertension, unspecified type    Rx / DC Orders ED Discharge Orders     None         Pauline Aus, PA-C 06/20/21 1208    Vanetta Mulders, MD 06/25/21 1031

## 2021-06-17 NOTE — ED Notes (Signed)
Pt verbalized understanding of discharge paperwork.

## 2021-06-17 NOTE — ED Triage Notes (Signed)
Pt presents to ED with complaints of left sided rib pain. Pt states he was getting up off the couch and heard a pop.

## 2022-03-03 ENCOUNTER — Emergency Department (HOSPITAL_COMMUNITY)
Admission: EM | Admit: 2022-03-03 | Discharge: 2022-03-03 | Disposition: A | Discharge status: Home / Self Care | Payer: Medicaid Other | Attending: Emergency Medicine | Admitting: Emergency Medicine

## 2022-03-03 ENCOUNTER — Other Ambulatory Visit: Payer: Self-pay

## 2022-03-03 ENCOUNTER — Emergency Department (HOSPITAL_COMMUNITY): Payer: Medicaid Other

## 2022-03-03 ENCOUNTER — Encounter (HOSPITAL_COMMUNITY): Payer: Self-pay | Admitting: *Deleted

## 2022-03-03 DIAGNOSIS — S62316A Displaced fracture of base of fifth metacarpal bone, right hand, initial encounter for closed fracture: Secondary | ICD-10-CM | POA: Diagnosis not present

## 2022-03-03 DIAGNOSIS — S6991XA Unspecified injury of right wrist, hand and finger(s), initial encounter: Secondary | ICD-10-CM | POA: Diagnosis present

## 2022-03-03 DIAGNOSIS — W133XXA Fall through floor, initial encounter: Secondary | ICD-10-CM | POA: Insufficient documentation

## 2022-03-03 DIAGNOSIS — I10 Essential (primary) hypertension: Secondary | ICD-10-CM | POA: Diagnosis not present

## 2022-03-03 DIAGNOSIS — R000 Tachycardia, unspecified: Secondary | ICD-10-CM | POA: Diagnosis not present

## 2022-03-03 DIAGNOSIS — Z79899 Other long term (current) drug therapy: Secondary | ICD-10-CM | POA: Diagnosis not present

## 2022-03-03 DIAGNOSIS — S62306A Unspecified fracture of fifth metacarpal bone, right hand, initial encounter for closed fracture: Secondary | ICD-10-CM

## 2022-03-03 MED ORDER — LISINOPRIL 10 MG PO TABS
10.0000 mg | ORAL_TABLET | Freq: Once | ORAL | Status: AC
  Administered 2022-03-03: 10 mg via ORAL
  Filled 2022-03-03: qty 1

## 2022-03-03 MED ORDER — HYDROCODONE-ACETAMINOPHEN 5-325 MG PO TABS
1.0000 | ORAL_TABLET | Freq: Once | ORAL | Status: AC
  Administered 2022-03-03: 1 via ORAL
  Filled 2022-03-03: qty 1

## 2022-03-03 MED ORDER — LISINOPRIL-HYDROCHLOROTHIAZIDE 10-12.5 MG PO TABS: 1.0000 | ORAL_TABLET | Freq: Every day | ORAL | 1 refills | Status: AC

## 2022-03-03 NOTE — ED Provider Notes (Signed)
[facility] Provider Note   CSN: [id number] Arrival date & time: 03/03/22  1833     History  Chief Complaint  Patient presents with   Hand Injury    CASTLE LAMONS is a 46 y.o. male.   Hand Injury    Patient presented to the emergency room for evaluation of a hand injury.  Patient states he fell close fist onto his right hand onto a hard floor.  Since that time he has noticed swelling on the ulnar aspect of his hand.  It hurts to make a fist.  Also has history of hypertension.  He ran out of his medications a few days ago.  He is not have any chest pain or shortness of breath.  Home Medications Prior to Admission medications   Medication Sig Start Date End Date Taking? Authorizing Provider  lisinopril-hydrochlorothiazide (ZESTORETIC) 10-12.5 MG tablet Take 1 tablet by mouth daily. 03/03/22 05/02/22 Yes Dorie Rank, MD  albuterol (PROVENTIL HFA;VENTOLIN HFA) 108 (90 Base) MCG/ACT inhaler Inhale 2 puffs into the lungs every 6 (six) hours as needed for wheezing or shortness of breath.    [provider]  cyclobenzaprine (FLEXERIL) 10 MG tablet Take 1 tablet (10 mg total) by mouth 3 (three) times daily as needed for muscle spasms. 7/40/81   Delora Fuel, MD  ibuprofen (ADVIL,MOTRIN) 600 MG tablet Take 1 tablet (600 mg total) by mouth every 6 (six) hours as needed. 05/24/17   Noemi Chapel, MD  methocarbamol (ROBAXIN) 500 MG tablet Take 1 tablet (500 mg total) by mouth 2 (two) times daily as needed for muscle spasms. 05/24/17   Noemi Chapel, MD  oxyCODONE-acetaminophen (PERCOCET/ROXICET) 5-325 MG tablet Take 1 tablet by mouth every 4 (four) hours as needed. 06/17/21   Triplett, Lynelle Smoke, PA-C      Allergies    Patient has no known allergies.    Review of Systems   Review of Systems  Physical Exam Updated Vital Signs BP (!) 182/117 (BP Location: Left Arm)   Pulse (!) 120   Temp 98 F (36.7 C) (Oral)   Resp 19   Ht 1.727 m (5\' 8" )   Wt 56.7 kg    SpO2 97%   BMI 19.01 kg/m  Physical Exam Vitals and nursing note reviewed.  Constitutional:      General: He is not in acute distress.    Appearance: He is well-developed.  HENT:     Head: Normocephalic and atraumatic.     Right Ear: External ear normal.     Left Ear: External ear normal.  Eyes:     General: No scleral icterus.       Right eye: No discharge.        Left eye: No discharge.     Conjunctiva/sclera: Conjunctivae normal.  Neck:     Trachea: No tracheal deviation.  Cardiovascular:     Rate and Rhythm: Normal rate.  Pulmonary:     Effort: Pulmonary effort is normal. No respiratory distress.     Breath sounds: No stridor.  Abdominal:     General: There is no distension.  Musculoskeletal:        General: Swelling, tenderness and deformity present.     Cervical back: Neck supple.     Comments: Tenderness palpation and swelling noted distal fifth metacarpal.,  Neurovascular intact  Skin:    General: Skin is warm and dry.     Findings: No rash.  Neurological:     Mental Status: He is alert.  Cranial Nerves: Cranial nerve deficit: no gross deficits.     ED Results / Procedures / Treatments   Labs (all labs ordered are listed, but only abnormal results are displayed) Labs Reviewed - No data to display  EKG None  Radiology DG Hand Complete Right  Result Date: 03/03/2022 CLINICAL DATA:  Fall, right hand swelling and pain EXAM: RIGHT HAND - COMPLETE 3 VIEW COMPARISON:  None Available. FINDINGS: Fracture through the distal fifth metacarpal, with volar angulation of the fifth metatarsal head. The fracture does not appear to involve the articular surface. Overlying soft tissue swelling. No additional fracture is seen in the hand. IMPRESSION: Distal fifth metacarpal fracture. Electronically Signed   By: Wiliam Ke M.D.   On: 03/03/2022 19:03    Procedures Procedures    Medications Ordered in ED Medications  lisinopril (ZESTRIL) tablet 10 mg (has no  administration in time range)  HYDROcodone-acetaminophen (NORCO/VICODIN) 5-325 MG per tablet 1 tablet (has no administration in time range)    ED Course/ Medical Decision Making/ A&P Clinical Course as of 03/03/22 2104  Wed Mar 03, 2022  2054 X-ray shows 1/5 metacarpal fracture [JK]    Clinical Course User Index [JK] Linwood Dibbles, MD                           Medical Decision Making Problems Addressed: Closed displaced fracture of fifth metacarpal bone of right hand, unspecified portion of metacarpal, initial encounter: acute illness or injury that poses a threat to life or bodily functions Essential hypertension: chronic illness or injury with exacerbation, progression, or side effects of treatment  Amount and/or Complexity of Data Reviewed Radiology: ordered and independent interpretation performed.  Risk Prescription drug management.   Patient has evidence of fifth metacarpal fracture.  We will place him in a splint outpatient follow-up with orthopedics.  Patient also noted to be hypertensive.  He has been out of his medications.  Will give him a dose of his blood pressure medications today and refill his medication.  Tachycardia noted on initial vital signs.  Heart rate in the 90s on my exam at bedside        Final Clinical Impression(s) / ED Diagnoses Final diagnoses:  Closed displaced fracture of fifth metacarpal bone of right hand, unspecified portion of metacarpal, initial encounter    Rx / DC Orders ED Discharge Orders          Ordered    lisinopril-hydrochlorothiazide (ZESTORETIC) 10-12.5 MG tablet  Daily        03/03/22 2104              Linwood Dibbles, MD 03/03/22 2107

## 2022-03-03 NOTE — Discharge Instructions (Addendum)
Wear the splint all times.  Follow-up with the orthopedic doctor for further treatment of your metacarpal fracture.  Start taking your blood pressure medications again and follow-up with a primary care doctor to have that rechecked.

## 2022-03-03 NOTE — ED Triage Notes (Addendum)
Pt states he landed on his right hand hard last night after his leg gave out.  Pt with swelling to hand since.  Not able to make a fist.    Pt states he ran out his HTN medication two days ago and does not have PCP to have refilled.  Pt's bp is elevated in triage at 182/117

## 2022-03-03 NOTE — ED Notes (Signed)
ED Provider at bedside. 

## 2022-03-04 ENCOUNTER — Telehealth: Payer: Self-pay | Admitting: Orthopedic Surgery

## 2022-03-04 NOTE — Telephone Encounter (Signed)
Called back to patient to schedule accordingly; left message to return call.

## 2022-03-04 NOTE — Telephone Encounter (Signed)
Regarding patient's appointment for tomorrow with Dr Aline Brochure, 03/05/22, per [facility] for follow up/patient visit, Abigail Butts had reviewed and will contact patient's [place] Medicaid/[employer] out of state [employer].

## 2022-03-04 NOTE — Telephone Encounter (Signed)
Schedule now, for first available, or wait for your contact to insurer?

## 2022-03-04 NOTE — Telephone Encounter (Signed)
Patient called, patient's wife on line also - called from ph# [phone], relaying he was seen at [facility] Emergency room last night for his hand injury. Notes indicate "Closed displaced fracture of fifth metacarpal bone of right hand."  Patient and wife asked about their insurance: Out of [facility] Medicaid Advantage [place].  States 'your office probably does not accept.' Relayed our clinic is not in network; however, aware I will send message for review and further advice.

## 2022-03-05 ENCOUNTER — Ambulatory Visit (INDEPENDENT_AMBULATORY_CARE_PROVIDER_SITE_OTHER): Payer: Medicaid Other | Admitting: Orthopedic Surgery

## 2022-03-05 ENCOUNTER — Encounter: Payer: Self-pay | Admitting: Orthopedic Surgery

## 2022-03-05 DIAGNOSIS — S62366A Nondisplaced fracture of neck of fifth metacarpal bone, right hand, initial encounter for closed fracture: Secondary | ICD-10-CM | POA: Diagnosis not present

## 2022-03-05 MED ORDER — HYDROCODONE-ACETAMINOPHEN 7.5-325 MG PO TABS: 1.0000 | ORAL_TABLET | ORAL | 0 refills | Status: DC | PRN

## 2022-03-05 MED ORDER — IBUPROFEN 800 MG PO TABS: 800.0000 mg | ORAL_TABLET | Freq: Three times a day (TID) | ORAL | 1 refills | Status: AC | PRN

## 2022-03-05 NOTE — Telephone Encounter (Signed)
As noted in account note today (day of patient's initial visit) 03/05/22, coverage verified as active, effective date of plan 02/05/2021, per phone w/[place] [employer], [place], representative Irven [place], phone number on back of card; [phone], call reference # [id number]. Cards scanned.

## 2022-03-05 NOTE — Progress Notes (Signed)
Chief Complaint  Patient presents with   New Problem   Hand Injury    RT hand injury DOI 03/03/22    46 year old male injured right hand secondary to a fall 2 days ago.  Was placed in a splint he is here for management decision.  Complains of pain over his right hand and some burning and tingling in the right arm  He has pain 7-8 out of 10  He did get some medication in the ER to take but he is out of that now  Review of systems various chronic back pain complaints no numbness or tingling related to that no fever no malaise no unexpected weight loss  Past Medical History:  Diagnosis Date   Asthma    Back pain, chronic    Hypertension    Kidney stone    Kidney stones       He is awake alert and oriented x3 mood and affect are normal  General appearance grooming hygiene acceptable body habitus ectomorphic  Cardiovascular exam normal pulse perfusion and capillary refill good pulses noted radial and ulnar artery  Sensation is normal improved after splint removal  Pain over the fifth metacarpal head  Both hands compared for finger alignment was normal no rotatory deformity  Muscle tone normal  Range of motion hand diminished because of pain   Images were interpreted.  I interpret the x-ray as fracture fifth metacarpal neck mild flexion otherwise stable  2 weeks in a splint and then follow-up for x-rays and then buddy tape with active range of motion is the plan  Medication given for pain   Encounter Diagnosis  Name Primary?   Closed nondisplaced fracture of neck of fifth metacarpal bone of right hand, initial encounter Yes   Meds ordered this encounter  Medications   HYDROcodone-acetaminophen (NORCO) 7.5-325 MG tablet    Sig: Take 1 tablet by mouth every 4 (four) hours as needed for up to 5 days for moderate pain.    Dispense:  30 tablet    Refill:  0   ibuprofen (ADVIL) 800 MG tablet    Sig: Take 1 tablet (800 mg total) by mouth every 8 (eight) hours as  needed.    Dispense:  90 tablet    Refill:  1   Appropriate precautions given about opioids and NSAIDs

## 2022-03-10 ENCOUNTER — Telehealth: Payer: Self-pay | Admitting: Radiology

## 2022-03-10 DIAGNOSIS — S62366A Nondisplaced fracture of neck of fifth metacarpal bone, right hand, initial encounter for closed fracture: Secondary | ICD-10-CM

## 2022-03-10 NOTE — Telephone Encounter (Signed)
Wife Sharyn Lull called, said that the pharmacy the pharmacy that the hydrocodone was sent to only had #20 tablets to give patient.  That was 03/05/22.  She is asking if we can send another Rx to [facility].  She has verified that [facility] has the medication.  Please call to advise.  [phone].

## 2022-03-11 ENCOUNTER — Other Ambulatory Visit: Payer: Self-pay | Admitting: Orthopedic Surgery

## 2022-03-11 ENCOUNTER — Telehealth: Payer: Self-pay | Admitting: Radiology

## 2022-03-11 DIAGNOSIS — S62366A Nondisplaced fracture of neck of fifth metacarpal bone, right hand, initial encounter for closed fracture: Secondary | ICD-10-CM

## 2022-03-11 MED ORDER — HYDROCODONE-ACETAMINOPHEN 7.5-325 MG PO TABS: 1.0000 | ORAL_TABLET | ORAL | 0 refills | Status: DC | PRN

## 2022-03-11 MED ORDER — HYDROCODONE-ACETAMINOPHEN 5-325 MG PO TABS: 1.0000 | ORAL_TABLET | Freq: Four times a day (QID) | ORAL | 0 refills | Status: DC | PRN

## 2022-03-11 NOTE — Progress Notes (Signed)
Meds ordered this encounter  Medications   HYDROcodone-acetaminophen (NORCO) 7.5-325 MG tablet    Sig: Take 1 tablet by mouth every 4 (four) hours as needed for up to 5 days for moderate pain.    Dispense:  30 tablet    Refill:  0    

## 2022-03-11 NOTE — Telephone Encounter (Signed)
That's very vague so I ll guess 5 mg tablets are ok ????

## 2022-03-11 NOTE — Telephone Encounter (Signed)
Hydrocodone 7.5 out of stock [facility] wife has called several pharmacies they are all out  Wants to know if you will send in something else

## 2022-03-11 NOTE — Telephone Encounter (Signed)
Patient and wife called again, is there another medication that you can send in?  She has called multiple pharmacies even into [facility] and cannot find anyone who has it to offer.  They are only giving it to patients who have been recently prescribed thru them, not to new patients.

## 2022-03-11 NOTE — Telephone Encounter (Signed)
Call received back from [place] stating she's called multiple pharmacies and no one has 7.5-325 mg or 10-325 mg available. Let her know I would pass the message on to Dr. Aline Brochure for further advice.

## 2022-03-11 NOTE — Progress Notes (Signed)
Meds ordered this encounter  Medications  . HYDROcodone-acetaminophen (NORCO/VICODIN) 5-325 MG tablet    Sig: Take 1 tablet by mouth every 6 (six) hours as needed for up to 5 days for moderate pain.    Dispense:  20 tablet    Refill:  0    

## 2022-03-11 NOTE — Telephone Encounter (Signed)
Patient/wife, designated contact on file, called back to ask about status of prescription. Relayed per chart, to check at pharmacy, per chart, Dr Harrison's having sent in today.  Asking if normal that patient should be still having some pain and swelling. Please advise.

## 2022-03-11 NOTE — Telephone Encounter (Signed)
YES, he has a fracture it will hurt and swell, that's why we have given pain meds and Ibuprofen 20 is the most we can give. He should ice elevate.  Wife states the pharmacy is out of the Hydrocodone doesn't know when it will be in stock  We can send to another pharmacy, she will find out who else takes Medicaid and let us know   She states he is doing all the ice elevate and ibuprofen I told her we can resend to another pharmacy she will call back to let us know where to resend. To you FYI

## 2022-03-16 ENCOUNTER — Other Ambulatory Visit: Payer: Self-pay | Admitting: Orthopedic Surgery

## 2022-03-16 NOTE — Telephone Encounter (Signed)
Patient called for refill; states he has just finished medication  HYDROcodone-acetaminophen (NORCO/VICODIN) 5-325 MG tablet 20 tablet       [facility]       -patient aware of appointment scheduled for 03/18/22

## 2022-03-17 MED ORDER — HYDROCODONE-ACETAMINOPHEN 5-325 MG PO TABS: 1.0000 | ORAL_TABLET | Freq: Four times a day (QID) | ORAL | 0 refills | Status: DC | PRN

## 2022-03-18 ENCOUNTER — Ambulatory Visit (INDEPENDENT_AMBULATORY_CARE_PROVIDER_SITE_OTHER): Payer: Medicaid Other | Admitting: Orthopedic Surgery

## 2022-03-18 ENCOUNTER — Encounter: Payer: Self-pay | Admitting: Orthopedic Surgery

## 2022-03-18 ENCOUNTER — Ambulatory Visit (INDEPENDENT_AMBULATORY_CARE_PROVIDER_SITE_OTHER): Payer: Medicaid Other

## 2022-03-18 VITALS — Ht 68.0 in | Wt 125.0 lb

## 2022-03-18 DIAGNOSIS — S62366D Nondisplaced fracture of neck of fifth metacarpal bone, right hand, subsequent encounter for fracture with routine healing: Secondary | ICD-10-CM | POA: Diagnosis not present

## 2022-03-18 NOTE — Progress Notes (Signed)
FOLLOW UP   Encounter Diagnosis  Name Primary?   Closed nondisplaced fracture of neck of fifth metacarpal bone of right hand with routine healing, subsequent encounter Yes     Chief Complaint  Patient presents with   Fracture    Rt hand DOI 03/03/22     15 days after injury   He s been in a splint   The fracture slipped a little but there is no clinic deformity so I m going to continue with AROM to maintain ROM     Xrays in 4 weeks

## 2022-03-22 ENCOUNTER — Telehealth: Payer: Self-pay | Admitting: Radiology

## 2022-03-22 ENCOUNTER — Other Ambulatory Visit: Payer: Self-pay | Admitting: Radiology

## 2022-03-22 MED ORDER — HYDROCODONE-ACETAMINOPHEN 5-325 MG PO TABS: 1.0000 | ORAL_TABLET | Freq: Three times a day (TID) | ORAL | 0 refills | Status: DC | PRN

## 2022-03-22 NOTE — Telephone Encounter (Signed)
Received a prior auth for the hydrocodone, his medicaid will not cover, if he wants this med he will need to purchase, they will not cover again until next month. I will call him to let him know.

## 2022-03-22 NOTE — Telephone Encounter (Signed)
Patient called, LMVM requesting refill hydrocodone.

## 2022-03-23 NOTE — Telephone Encounter (Signed)
Left message for him to call back.  

## 2022-04-01 ENCOUNTER — Other Ambulatory Visit: Payer: Self-pay | Admitting: Orthopedic Surgery

## 2022-04-12 ENCOUNTER — Other Ambulatory Visit: Payer: Self-pay | Admitting: Orthopedic Surgery

## 2022-04-12 NOTE — Telephone Encounter (Signed)
I tried calling patient to advise, call cannot be completed as dialed.  If patient calls back, please advise per Dr Lemmie Evens note in message.  Thanks.

## 2022-04-12 NOTE — Telephone Encounter (Signed)
No more opioids   Tylenol 500 q 6 and ibuprofen 800 q8

## 2022-04-15 ENCOUNTER — Encounter: Payer: Medicaid Other | Admitting: Orthopedic Surgery

## 2022-04-15 DIAGNOSIS — S62366D Nondisplaced fracture of neck of fifth metacarpal bone, right hand, subsequent encounter for fracture with routine healing: Secondary | ICD-10-CM | POA: Insufficient documentation

## 2022-04-15 NOTE — Progress Notes (Deleted)
Fracture care follow-up  No chief complaint on file.   Encounter Diagnosis  Name Primary?   Closed nondisplaced fracture of neck of fifth metacarpal bone of right hand with routine healing, subsequent encounter 03/03/22 Yes    46 year old male fifth metacarpal boxer's fracture 6 weeks post injury  Current treatment active range of motion  X-ray today
